# Patient Record
Sex: Male | Born: 1935 | Race: White | Hispanic: No | Smoking: Former smoker
Health system: Southern US, Community
[De-identification: ages and names within clinical notes are randomized; demographics above are authoritative.]

## PROBLEM LIST (undated history)

## (undated) DIAGNOSIS — I509 Heart failure, unspecified: Secondary | ICD-10-CM

## (undated) DIAGNOSIS — N183 Chronic kidney disease, stage 3 unspecified: Secondary | ICD-10-CM

## (undated) DIAGNOSIS — I10 Essential (primary) hypertension: Secondary | ICD-10-CM

## (undated) DIAGNOSIS — I2510 Atherosclerotic heart disease of native coronary artery without angina pectoris: Secondary | ICD-10-CM

## (undated) DIAGNOSIS — M1990 Unspecified osteoarthritis, unspecified site: Secondary | ICD-10-CM

## (undated) DIAGNOSIS — I219 Acute myocardial infarction, unspecified: Secondary | ICD-10-CM

## (undated) DIAGNOSIS — D649 Anemia, unspecified: Secondary | ICD-10-CM

## (undated) DIAGNOSIS — E119 Type 2 diabetes mellitus without complications: Secondary | ICD-10-CM

## (undated) HISTORY — DX: Acute myocardial infarction, unspecified: I21.9

## (undated) HISTORY — PX: TOTAL HIP ARTHROPLASTY: SHX124

## (undated) HISTORY — DX: Heart failure, unspecified: I50.9

## (undated) HISTORY — PX: ENUCLEATION: SHX628

## (undated) HISTORY — DX: Unspecified osteoarthritis, unspecified site: M19.90

## (undated) HISTORY — DX: Atherosclerotic heart disease of native coronary artery without angina pectoris: I25.10

## (undated) HISTORY — DX: Anemia, unspecified: D64.9

## (undated) HISTORY — DX: Type 2 diabetes mellitus without complications: E11.9

## (undated) HISTORY — PX: APPENDECTOMY: SHX54

## (undated) HISTORY — PX: HIP HARDWARE REMOVAL: SUR1127

## (undated) HISTORY — DX: Chronic kidney disease, stage 3 unspecified: N18.30

## (undated) HISTORY — PX: CORONARY ARTERY BYPASS GRAFT: SHX141

## (undated) HISTORY — DX: Chronic kidney disease, stage 3 (moderate): N18.3

## (undated) HISTORY — DX: Essential (primary) hypertension: I10

---

## 2005-09-07 ENCOUNTER — Other Ambulatory Visit: Payer: Self-pay

## 2005-09-07 ENCOUNTER — Inpatient Hospital Stay: Payer: Self-pay | Admitting: Orthopaedic Surgery

## 2005-09-09 ENCOUNTER — Other Ambulatory Visit: Payer: Self-pay

## 2006-01-04 ENCOUNTER — Inpatient Hospital Stay: Payer: Self-pay | Admitting: General Practice

## 2008-03-29 ENCOUNTER — Inpatient Hospital Stay: Payer: Self-pay | Admitting: Internal Medicine

## 2008-03-29 ENCOUNTER — Other Ambulatory Visit: Payer: Self-pay

## 2009-12-09 ENCOUNTER — Ambulatory Visit: Payer: Self-pay

## 2009-12-16 ENCOUNTER — Ambulatory Visit: Payer: Self-pay | Admitting: Unknown Physician Specialty

## 2009-12-19 ENCOUNTER — Ambulatory Visit: Payer: Self-pay | Admitting: Unknown Physician Specialty

## 2011-05-17 ENCOUNTER — Ambulatory Visit: Payer: Self-pay | Admitting: Internal Medicine

## 2011-05-25 ENCOUNTER — Inpatient Hospital Stay: Payer: Self-pay | Admitting: Student

## 2011-06-16 ENCOUNTER — Ambulatory Visit: Payer: Self-pay | Admitting: Internal Medicine

## 2011-08-21 ENCOUNTER — Encounter: Payer: Self-pay | Admitting: Cardiology

## 2011-09-14 ENCOUNTER — Encounter: Payer: Self-pay | Admitting: Cardiology

## 2011-10-15 ENCOUNTER — Encounter: Payer: Self-pay | Admitting: Cardiology

## 2011-10-30 ENCOUNTER — Ambulatory Visit: Payer: Self-pay | Admitting: Internal Medicine

## 2011-10-30 ENCOUNTER — Inpatient Hospital Stay: Payer: Self-pay | Admitting: Surgery

## 2011-10-30 LAB — PROTIME-INR
INR: 1.1
Prothrombin Time: 14.5 secs (ref 11.5–14.7)

## 2011-10-30 LAB — CBC WITH DIFFERENTIAL/PLATELET
Basophil #: 0 10*3/uL (ref 0.0–0.1)
Basophil %: 0 %
Eosinophil #: 0 10*3/uL (ref 0.0–0.7)
Eosinophil %: 0.3 %
HCT: 29.4 % — ABNORMAL LOW (ref 40.0–52.0)
Lymphocyte %: 13.8 %
MCHC: 34.4 g/dL (ref 32.0–36.0)
MCV: 93 fL (ref 80–100)
Neutrophil #: 7.7 10*3/uL — ABNORMAL HIGH (ref 1.4–6.5)
Neutrophil %: 80.3 %
RBC: 3.18 10*6/uL — ABNORMAL LOW (ref 4.40–5.90)
RDW: 14.8 % — ABNORMAL HIGH (ref 11.5–14.5)

## 2011-10-30 LAB — COMPREHENSIVE METABOLIC PANEL
Albumin: 2 g/dL — ABNORMAL LOW (ref 3.4–5.0)
Alkaline Phosphatase: 330 U/L — ABNORMAL HIGH (ref 50–136)
Anion Gap: 17 — ABNORMAL HIGH (ref 7–16)
Bilirubin,Total: 7.6 mg/dL — ABNORMAL HIGH (ref 0.2–1.0)
Co2: 19 mmol/L — ABNORMAL LOW (ref 21–32)
EGFR (African American): 12 — ABNORMAL LOW
EGFR (Non-African Amer.): 10 — ABNORMAL LOW
Osmolality: 300 (ref 275–301)
Potassium: 4.6 mmol/L (ref 3.5–5.1)
SGPT (ALT): 30 U/L
Sodium: 131 mmol/L — ABNORMAL LOW (ref 136–145)
Total Protein: 5.1 g/dL — ABNORMAL LOW (ref 6.4–8.2)

## 2011-10-30 LAB — LIPASE, BLOOD: Lipase: 1377 U/L — ABNORMAL HIGH (ref 73–393)

## 2011-10-31 LAB — COMPREHENSIVE METABOLIC PANEL
Albumin: 1.9 g/dL — ABNORMAL LOW (ref 3.4–5.0)
Anion Gap: 14 (ref 7–16)
Bilirubin,Total: 6.8 mg/dL — ABNORMAL HIGH (ref 0.2–1.0)
Co2: 22 mmol/L (ref 21–32)
Creatinine: 4.68 mg/dL — ABNORMAL HIGH (ref 0.60–1.30)
EGFR (Non-African Amer.): 10 — ABNORMAL LOW
Osmolality: 300 (ref 275–301)
SGPT (ALT): 26 U/L
Sodium: 130 mmol/L — ABNORMAL LOW (ref 136–145)
Total Protein: 5.5 g/dL — ABNORMAL LOW (ref 6.4–8.2)

## 2011-10-31 LAB — LIPASE, BLOOD: Lipase: 1237 U/L — ABNORMAL HIGH (ref 73–393)

## 2011-10-31 LAB — URINALYSIS, COMPLETE
Granular Cast: 38
Leukocyte Esterase: NEGATIVE
Nitrite: NEGATIVE
Protein: 30
Specific Gravity: 1.012 (ref 1.003–1.030)
Squamous Epithelial: NONE SEEN
Transitional Epi: <1
WBC UR: 35 /HPF (ref 0–5)

## 2011-10-31 LAB — CBC WITH DIFFERENTIAL/PLATELET
Basophil #: 0 10*3/uL (ref 0.0–0.1)
Basophil %: 0.2 %
Eosinophil #: 0.1 10*3/uL (ref 0.0–0.7)
Eosinophil %: 0.5 %
HGB: 9.9 g/dL — ABNORMAL LOW (ref 13.0–18.0)
Monocyte #: 0.5 x10 3/mm (ref 0.2–1.0)
Neutrophil #: 9.8 10*3/uL — ABNORMAL HIGH (ref 1.4–6.5)
Neutrophil %: 90 %
Platelet: 74 10*3/uL — ABNORMAL LOW (ref 150–440)
RDW: 14.5 % (ref 11.5–14.5)
WBC: 10.8 10*3/uL — ABNORMAL HIGH (ref 3.8–10.6)

## 2011-11-01 LAB — COMPREHENSIVE METABOLIC PANEL
Albumin: 1.6 g/dL — ABNORMAL LOW (ref 3.4–5.0)
Alkaline Phosphatase: 260 U/L — ABNORMAL HIGH (ref 50–136)
BUN: 82 mg/dL — ABNORMAL HIGH (ref 7–18)
Calcium, Total: 7.6 mg/dL — ABNORMAL LOW (ref 8.5–10.1)
Creatinine: 3.6 mg/dL — ABNORMAL HIGH (ref 0.60–1.30)
EGFR (African American): 16 — ABNORMAL LOW
EGFR (Non-African Amer.): 14 — ABNORMAL LOW
Glucose: 82 mg/dL (ref 65–99)
Osmolality: 294 (ref 275–301)
Potassium: 3.6 mmol/L (ref 3.5–5.1)
SGOT(AST): 32 U/L (ref 15–37)
SGPT (ALT): 19 U/L

## 2011-11-01 LAB — PROTEIN ELECTROPHORESIS(ARMC)

## 2011-11-02 LAB — PROTEIN / CREATININE RATIO, URINE
Creatinine, Urine: 168.7 mg/dL — ABNORMAL HIGH (ref 30.0–125.0)
Protein, Random Urine: 44 mg/dL — ABNORMAL HIGH (ref 0–12)
Protein/Creat. Ratio: 261 mg/gCREAT — ABNORMAL HIGH (ref 0–200)

## 2011-11-02 LAB — CBC WITH DIFFERENTIAL/PLATELET
Basophil %: 0.2 %
HCT: 25.8 % — ABNORMAL LOW (ref 40.0–52.0)
HGB: 8.6 g/dL — ABNORMAL LOW (ref 13.0–18.0)
Lymphocyte %: 9.9 %
MCV: 94 fL (ref 80–100)
Monocyte #: 1 x10 3/mm (ref 0.2–1.0)
Monocyte %: 12.1 %
Neutrophil %: 76.8 %
RBC: 2.76 10*6/uL — ABNORMAL LOW (ref 4.40–5.90)
RDW: 14.3 % (ref 11.5–14.5)
WBC: 8.7 10*3/uL (ref 3.8–10.6)

## 2011-11-02 LAB — COMPREHENSIVE METABOLIC PANEL
Alkaline Phosphatase: 238 U/L — ABNORMAL HIGH (ref 50–136)
Anion Gap: 11 (ref 7–16)
Chloride: 102 mmol/L (ref 98–107)
Co2: 25 mmol/L (ref 21–32)
EGFR (Non-African Amer.): 22 — ABNORMAL LOW
Osmolality: 289 (ref 275–301)
SGOT(AST): 31 U/L (ref 15–37)
SGPT (ALT): 14 U/L
Total Protein: 4.9 g/dL — ABNORMAL LOW (ref 6.4–8.2)

## 2011-11-02 LAB — PHOSPHORUS: Phosphorus: 3 mg/dL (ref 2.5–4.9)

## 2011-11-02 LAB — LIPASE, BLOOD: Lipase: 974 U/L — ABNORMAL HIGH (ref 73–393)

## 2011-11-03 LAB — COMPREHENSIVE METABOLIC PANEL
Albumin: 1.5 g/dL — ABNORMAL LOW (ref 3.4–5.0)
Alkaline Phosphatase: 239 U/L — ABNORMAL HIGH (ref 50–136)
Anion Gap: 9 (ref 7–16)
BUN: 28 mg/dL — ABNORMAL HIGH (ref 7–18)
Calcium, Total: 7.3 mg/dL — ABNORMAL LOW (ref 8.5–10.1)
Chloride: 101 mmol/L (ref 98–107)
Co2: 26 mmol/L (ref 21–32)
EGFR (African American): 35 — ABNORMAL LOW
EGFR (Non-African Amer.): 30 — ABNORMAL LOW
Glucose: 99 mg/dL (ref 65–99)
Potassium: 3.3 mmol/L — ABNORMAL LOW (ref 3.5–5.1)
SGOT(AST): 34 U/L (ref 15–37)

## 2011-11-03 LAB — LIPASE, BLOOD: Lipase: 738 U/L — ABNORMAL HIGH (ref 73–393)

## 2011-11-03 LAB — HEMOGLOBIN: HGB: 8.1 g/dL — ABNORMAL LOW (ref 13.0–18.0)

## 2011-11-04 LAB — BASIC METABOLIC PANEL
Anion Gap: 9 (ref 7–16)
Chloride: 104 mmol/L (ref 98–107)
Creatinine: 2.5 mg/dL — ABNORMAL HIGH (ref 0.60–1.30)
EGFR (African American): 25 — ABNORMAL LOW
EGFR (Non-African Amer.): 22 — ABNORMAL LOW
Glucose: 214 mg/dL — ABNORMAL HIGH (ref 65–99)
Osmolality: 289 (ref 275–301)
Potassium: 4.3 mmol/L (ref 3.5–5.1)
Sodium: 138 mmol/L (ref 136–145)

## 2011-11-05 LAB — PHOSPHORUS: Phosphorus: 3.3 mg/dL (ref 2.5–4.9)

## 2011-11-05 LAB — BASIC METABOLIC PANEL
Anion Gap: 8 (ref 7–16)
Calcium, Total: 7.7 mg/dL — ABNORMAL LOW (ref 8.5–10.1)
Chloride: 106 mmol/L (ref 98–107)
Creatinine: 2.35 mg/dL — ABNORMAL HIGH (ref 0.60–1.30)
EGFR (African American): 27 — ABNORMAL LOW
Glucose: 177 mg/dL — ABNORMAL HIGH (ref 65–99)
Osmolality: 291 (ref 275–301)
Sodium: 140 mmol/L (ref 136–145)

## 2011-11-05 LAB — LIPASE, BLOOD: Lipase: 953 U/L — ABNORMAL HIGH (ref 73–393)

## 2011-11-05 LAB — HEPATIC FUNCTION PANEL A (ARMC)
Albumin: 1.6 g/dL — ABNORMAL LOW (ref 3.4–5.0)
Bilirubin, Direct: 1.6 mg/dL — ABNORMAL HIGH (ref 0.00–0.20)
Bilirubin,Total: 2.2 mg/dL — ABNORMAL HIGH (ref 0.2–1.0)
SGOT(AST): 33 U/L (ref 15–37)
SGPT (ALT): 11 U/L — ABNORMAL LOW
Total Protein: 5.4 g/dL — ABNORMAL LOW (ref 6.4–8.2)

## 2011-11-06 LAB — CBC WITH DIFFERENTIAL/PLATELET
Basophil #: 0.1 10*3/uL (ref 0.0–0.1)
Basophil %: 1.1 %
Eosinophil #: 0.2 10*3/uL (ref 0.0–0.7)
Eosinophil %: 3.9 %
HCT: 25.6 % — ABNORMAL LOW (ref 40.0–52.0)
HGB: 8.4 g/dL — ABNORMAL LOW (ref 13.0–18.0)
Lymphocyte %: 18.7 %
MCV: 95 fL (ref 80–100)
Monocyte %: 8.9 %
Neutrophil %: 67.4 %
Platelet: 195 10*3/uL (ref 150–440)
RDW: 14.7 % — ABNORMAL HIGH (ref 11.5–14.5)
WBC: 5.3 10*3/uL (ref 3.8–10.6)

## 2011-11-06 LAB — UR PROT ELECTROPHORESIS, URINE RANDOM

## 2011-11-07 LAB — CULTURE, BLOOD (SINGLE)

## 2011-11-08 LAB — BASIC METABOLIC PANEL
Anion Gap: 7 (ref 7–16)
BUN: 31 mg/dL — ABNORMAL HIGH (ref 7–18)
Calcium, Total: 7.2 mg/dL — ABNORMAL LOW (ref 8.5–10.1)
Creatinine: 1.67 mg/dL — ABNORMAL HIGH (ref 0.60–1.30)
EGFR (African American): 41 — ABNORMAL LOW
EGFR (Non-African Amer.): 35 — ABNORMAL LOW
Glucose: 76 mg/dL (ref 65–99)
Osmolality: 288 (ref 275–301)
Potassium: 5.4 mmol/L — ABNORMAL HIGH (ref 3.5–5.1)
Sodium: 142 mmol/L (ref 136–145)

## 2011-11-21 ENCOUNTER — Other Ambulatory Visit: Payer: Self-pay | Admitting: Nephrology

## 2011-11-21 LAB — PROTEIN URINE, QUAL: Protein: NEGATIVE

## 2011-11-21 LAB — RENAL FUNCTION PANEL
Albumin: 2.8 g/dL — ABNORMAL LOW (ref 3.4–5.0)
BUN: 34 mg/dL — ABNORMAL HIGH (ref 7–18)
Chloride: 99 mmol/L (ref 98–107)
EGFR (Non-African Amer.): 25 — ABNORMAL LOW
Phosphorus: 3.7 mg/dL (ref 2.5–4.9)

## 2011-11-21 LAB — CBC WITH DIFFERENTIAL/PLATELET
Eosinophil #: 0.6 10*3/uL (ref 0.0–0.7)
Eosinophil %: 9.6 %
HGB: 10.8 g/dL — ABNORMAL LOW (ref 13.0–18.0)
Lymphocyte #: 1.7 10*3/uL (ref 1.0–3.6)
MCH: 31.4 pg (ref 26.0–34.0)
Monocyte #: 0.5 x10 3/mm (ref 0.2–1.0)
Monocyte %: 7.9 %
Neutrophil #: 3.6 10*3/uL (ref 1.4–6.5)
Neutrophil %: 55.2 %
Platelet: 178 10*3/uL (ref 150–440)
RDW: 15.3 % — ABNORMAL HIGH (ref 11.5–14.5)
WBC: 6.5 10*3/uL (ref 3.8–10.6)

## 2012-07-02 ENCOUNTER — Inpatient Hospital Stay: Payer: Self-pay | Admitting: Orthopedic Surgery

## 2012-07-02 LAB — COMPREHENSIVE METABOLIC PANEL
Albumin: 2.8 g/dL — ABNORMAL LOW (ref 3.4–5.0)
Alkaline Phosphatase: 171 U/L — ABNORMAL HIGH (ref 50–136)
Anion Gap: 5 — ABNORMAL LOW (ref 7–16)
BUN: 33 mg/dL — ABNORMAL HIGH (ref 7–18)
Bilirubin,Total: 0.6 mg/dL (ref 0.2–1.0)
Calcium, Total: 8.8 mg/dL (ref 8.5–10.1)
Co2: 27 mmol/L (ref 21–32)
EGFR (African American): 40 — ABNORMAL LOW
Glucose: 107 mg/dL — ABNORMAL HIGH (ref 65–99)
Osmolality: 289 (ref 275–301)
Potassium: 4.9 mmol/L (ref 3.5–5.1)
SGPT (ALT): 14 U/L (ref 12–78)
Sodium: 141 mmol/L (ref 136–145)

## 2012-07-02 LAB — CBC WITH DIFFERENTIAL/PLATELET
Basophil #: 0 10*3/uL (ref 0.0–0.1)
Basophil %: 0.8 %
Eosinophil %: 4.3 %
HCT: 32.3 % — ABNORMAL LOW (ref 40.0–52.0)
HGB: 10.3 g/dL — ABNORMAL LOW (ref 13.0–18.0)
Lymphocyte %: 21.6 %
MCH: 31 pg (ref 26.0–34.0)
Monocyte %: 7 %
Neutrophil #: 3.6 10*3/uL (ref 1.4–6.5)
Platelet: 124 10*3/uL — ABNORMAL LOW (ref 150–440)
RBC: 3.33 10*6/uL — ABNORMAL LOW (ref 4.40–5.90)
WBC: 5.4 10*3/uL (ref 3.8–10.6)

## 2012-07-02 LAB — PROTIME-INR
INR: 0.9
Prothrombin Time: 12.8 secs (ref 11.5–14.7)

## 2012-07-03 HISTORY — PX: HIP PINNING: SHX1757

## 2012-07-03 LAB — BASIC METABOLIC PANEL
Anion Gap: 7 (ref 7–16)
BUN: 31 mg/dL — ABNORMAL HIGH (ref 7–18)
Calcium, Total: 8.5 mg/dL (ref 8.5–10.1)
Chloride: 106 mmol/L (ref 98–107)
Creatinine: 1.68 mg/dL — ABNORMAL HIGH (ref 0.60–1.30)
EGFR (Non-African Amer.): 35 — ABNORMAL LOW
Glucose: 121 mg/dL — ABNORMAL HIGH (ref 65–99)
Osmolality: 285 (ref 275–301)
Potassium: 4.3 mmol/L (ref 3.5–5.1)

## 2012-07-03 LAB — CBC WITH DIFFERENTIAL/PLATELET
Basophil #: 0 10*3/uL (ref 0.0–0.1)
Eosinophil #: 0.3 10*3/uL (ref 0.0–0.7)
HCT: 29.8 % — ABNORMAL LOW (ref 40.0–52.0)
HGB: 9.8 g/dL — ABNORMAL LOW (ref 13.0–18.0)
Lymphocyte %: 21 %
MCH: 32 pg (ref 26.0–34.0)
MCHC: 32.9 g/dL (ref 32.0–36.0)
Monocyte #: 0.5 x10 3/mm (ref 0.2–1.0)
Neutrophil #: 3.3 10*3/uL (ref 1.4–6.5)
Neutrophil %: 63.3 %
Platelet: 102 10*3/uL — ABNORMAL LOW (ref 150–440)
RDW: 13.4 % (ref 11.5–14.5)

## 2012-07-04 LAB — CBC WITH DIFFERENTIAL/PLATELET
Basophil %: 0.5 %
Eosinophil #: 0.2 10*3/uL (ref 0.0–0.7)
Eosinophil %: 3.3 %
HGB: 9.8 g/dL — ABNORMAL LOW (ref 13.0–18.0)
MCH: 35.4 pg — ABNORMAL HIGH (ref 26.0–34.0)
MCV: 97 fL (ref 80–100)
Monocyte #: 0.6 x10 3/mm (ref 0.2–1.0)
Monocyte %: 9.7 %
Neutrophil #: 4.1 10*3/uL (ref 1.4–6.5)
Neutrophil %: 65.9 %
Platelet: 103 10*3/uL — ABNORMAL LOW (ref 150–440)
RBC: 2.76 10*6/uL — ABNORMAL LOW (ref 4.40–5.90)
WBC: 6.2 10*3/uL (ref 3.8–10.6)

## 2012-07-04 LAB — URINALYSIS, COMPLETE
Bilirubin,UR: NEGATIVE
Blood: NEGATIVE
Glucose,UR: NEGATIVE mg/dL (ref 0–75)
Hyaline Cast: 3
Ketone: NEGATIVE
Leukocyte Esterase: NEGATIVE
Nitrite: NEGATIVE
Protein: NEGATIVE
Specific Gravity: 1.019 (ref 1.003–1.030)
WBC UR: 2 /HPF (ref 0–5)

## 2012-07-04 LAB — BASIC METABOLIC PANEL
Anion Gap: 8 (ref 7–16)
Calcium, Total: 8 mg/dL — ABNORMAL LOW (ref 8.5–10.1)
Chloride: 104 mmol/L (ref 98–107)
Co2: 26 mmol/L (ref 21–32)
EGFR (African American): 33 — ABNORMAL LOW
Sodium: 138 mmol/L (ref 136–145)

## 2012-07-05 LAB — BASIC METABOLIC PANEL
Anion Gap: 10 (ref 7–16)
BUN: 43 mg/dL — ABNORMAL HIGH (ref 7–18)
Chloride: 102 mmol/L (ref 98–107)
Co2: 24 mmol/L (ref 21–32)
Creatinine: 2 mg/dL — ABNORMAL HIGH (ref 0.60–1.30)
EGFR (African American): 33 — ABNORMAL LOW
Potassium: 4.2 mmol/L (ref 3.5–5.1)

## 2012-07-05 LAB — URINE CULTURE

## 2012-07-06 LAB — CREATININE, SERUM
Creatinine: 1.6 mg/dL — ABNORMAL HIGH (ref 0.60–1.30)
EGFR (African American): 43 — ABNORMAL LOW

## 2012-07-07 ENCOUNTER — Encounter: Payer: Self-pay | Admitting: Internal Medicine

## 2012-07-07 LAB — URINALYSIS, COMPLETE
Bilirubin,UR: NEGATIVE
Blood: NEGATIVE
Ketone: NEGATIVE
Ph: 5 (ref 4.5–8.0)
Protein: NEGATIVE
RBC,UR: 1 /HPF (ref 0–5)
Specific Gravity: 1.01 (ref 1.003–1.030)
Squamous Epithelial: <1

## 2012-07-22 ENCOUNTER — Inpatient Hospital Stay: Payer: Self-pay | Admitting: Internal Medicine

## 2012-07-22 LAB — URINALYSIS, COMPLETE
Blood: NEGATIVE
Glucose,UR: NEGATIVE mg/dL (ref 0–75)
Glucose,UR: NEGATIVE mg/dL (ref 0–75)
Hyaline Cast: 12
Ketone: NEGATIVE
Leukocyte Esterase: NEGATIVE
Nitrite: NEGATIVE
Ph: 5 (ref 4.5–8.0)
Protein: NEGATIVE
RBC,UR: 3 /HPF (ref 0–5)
RBC,UR: 2 /HPF (ref 0–5)
Specific Gravity: 1.02 (ref 1.003–1.030)
Squamous Epithelial: <1
WBC UR: <1 /HPF (ref 0–5)

## 2012-07-22 LAB — CBC WITH DIFFERENTIAL/PLATELET
Basophil #: 0 10*3/uL (ref 0.0–0.1)
HCT: 32.8 % — ABNORMAL LOW (ref 40.0–52.0)
HGB: 11.2 g/dL — ABNORMAL LOW (ref 13.0–18.0)
Lymphocyte #: 1 10*3/uL (ref 1.0–3.6)
MCHC: 34.1 g/dL (ref 32.0–36.0)
MCV: 98 fL (ref 80–100)
Monocyte %: 6.4 %
Platelet: 192 10*3/uL (ref 150–440)
RBC: 3.35 10*6/uL — ABNORMAL LOW (ref 4.40–5.90)
WBC: 9 10*3/uL (ref 3.8–10.6)

## 2012-07-22 LAB — COMPREHENSIVE METABOLIC PANEL
Albumin: 2.4 g/dL — ABNORMAL LOW (ref 3.4–5.0)
Alkaline Phosphatase: 183 U/L — ABNORMAL HIGH (ref 50–136)
Anion Gap: 11 (ref 7–16)
Bilirubin,Total: 1.5 mg/dL — ABNORMAL HIGH (ref 0.2–1.0)
Chloride: 98 mmol/L (ref 98–107)
Co2: 23 mmol/L (ref 21–32)
Creatinine: 2.61 mg/dL — ABNORMAL HIGH (ref 0.60–1.30)
Glucose: 215 mg/dL — ABNORMAL HIGH (ref 65–99)
Osmolality: 296 (ref 275–301)
SGOT(AST): 24 U/L (ref 15–37)

## 2012-07-22 LAB — APTT: Activated PTT: 31.2 secs (ref 23.6–35.9)

## 2012-07-22 LAB — TSH: Thyroid Stimulating Horm: 2.92 u[IU]/mL

## 2012-07-22 LAB — TROPONIN I
Troponin-I: 0.06 ng/mL — ABNORMAL HIGH
Troponin-I: 0.04 ng/mL

## 2012-07-22 LAB — CK TOTAL AND CKMB (NOT AT ARMC)
CK, Total: 43 U/L (ref 35–232)
CK-MB: 1.3 ng/mL (ref 0.5–3.6)

## 2012-07-22 LAB — PROTIME-INR: Prothrombin Time: 14.9 secs — ABNORMAL HIGH (ref 11.5–14.7)

## 2012-07-23 LAB — TROPONIN I: Troponin-I: 0.04 ng/mL

## 2012-07-23 LAB — COMPREHENSIVE METABOLIC PANEL
Alkaline Phosphatase: 161 U/L — ABNORMAL HIGH (ref 50–136)
Anion Gap: 10 (ref 7–16)
BUN: 83 mg/dL — ABNORMAL HIGH (ref 7–18)
Calcium, Total: 8.6 mg/dL (ref 8.5–10.1)
Chloride: 101 mmol/L (ref 98–107)
Co2: 24 mmol/L (ref 21–32)
Creatinine: 2.46 mg/dL — ABNORMAL HIGH (ref 0.60–1.30)
EGFR (Non-African Amer.): 22 — ABNORMAL LOW
Osmolality: 300 (ref 275–301)
Potassium: 4.5 mmol/L (ref 3.5–5.1)
SGOT(AST): 26 U/L (ref 15–37)
SGPT (ALT): 11 U/L — ABNORMAL LOW (ref 12–78)
Total Protein: 5.6 g/dL — ABNORMAL LOW (ref 6.4–8.2)

## 2012-07-23 LAB — CBC WITH DIFFERENTIAL/PLATELET
Eosinophil #: 0 10*3/uL (ref 0.0–0.7)
Eosinophil %: 0 %
HCT: 30.1 % — ABNORMAL LOW (ref 40.0–52.0)
HGB: 10.1 g/dL — ABNORMAL LOW (ref 13.0–18.0)
Lymphocyte #: 0.7 10*3/uL — ABNORMAL LOW (ref 1.0–3.6)
Lymphocyte %: 8.9 %
MCH: 32.9 pg (ref 26.0–34.0)
Monocyte #: 0.4 x10 3/mm (ref 0.2–1.0)
Monocyte %: 5.1 %
Neutrophil #: 6.5 10*3/uL (ref 1.4–6.5)
RBC: 3.08 10*6/uL — ABNORMAL LOW (ref 4.40–5.90)
RDW: 14.7 % — ABNORMAL HIGH (ref 11.5–14.5)
WBC: 7.5 10*3/uL (ref 3.8–10.6)

## 2012-07-23 LAB — CK TOTAL AND CKMB (NOT AT ARMC)
CK, Total: 28 U/L — ABNORMAL LOW (ref 35–232)
CK, Total: 22 U/L — ABNORMAL LOW (ref 35–232)
CK-MB: 1.1 ng/mL (ref 0.5–3.6)

## 2012-07-23 LAB — URINE CULTURE

## 2012-07-24 LAB — BASIC METABOLIC PANEL
Anion Gap: 9 (ref 7–16)
BUN: 85 mg/dL — ABNORMAL HIGH (ref 7–18)
Calcium, Total: 8.3 mg/dL — ABNORMAL LOW (ref 8.5–10.1)
Co2: 24 mmol/L (ref 21–32)
Glucose: 174 mg/dL — ABNORMAL HIGH (ref 65–99)
Potassium: 3.9 mmol/L (ref 3.5–5.1)
Sodium: 139 mmol/L (ref 136–145)

## 2012-07-25 LAB — BASIC METABOLIC PANEL
Anion Gap: 8 (ref 7–16)
BUN: 73 mg/dL — ABNORMAL HIGH (ref 7–18)
Calcium, Total: 8.5 mg/dL (ref 8.5–10.1)
Co2: 24 mmol/L (ref 21–32)
EGFR (African American): 36 — ABNORMAL LOW
EGFR (Non-African Amer.): 31 — ABNORMAL LOW
Osmolality: 306 (ref 275–301)
Potassium: 3.4 mmol/L — ABNORMAL LOW (ref 3.5–5.1)
Sodium: 141 mmol/L (ref 136–145)

## 2012-07-25 LAB — CBC WITH DIFFERENTIAL/PLATELET
Basophil #: 0 x10 3/mm 3
Basophil %: 0.1 %
Eosinophil #: 0.1 x10 3/mm 3
Eosinophil %: 2.2 %
HCT: 28.6 % — ABNORMAL LOW
HGB: 9.5 g/dL — ABNORMAL LOW
Lymphocyte %: 13.5 %
Lymphs Abs: 0.8 x10 3/mm 3 — ABNORMAL LOW
MCH: 32.7 pg
MCHC: 33.3 g/dL
MCV: 98 fL
Monocyte #: 0.7 "x10 3/mm "
Monocyte %: 11.7 %
Neutrophil #: 4.1 x10 3/mm 3
Neutrophil %: 72.5 %
Platelet: 177 x10 3/mm 3
RBC: 2.91 x10 6/mm 3 — ABNORMAL LOW
RDW: 15.1 % — ABNORMAL HIGH
WBC: 5.7 x10 3/mm 3

## 2012-07-26 LAB — COMPREHENSIVE METABOLIC PANEL
Albumin: 1.8 g/dL — ABNORMAL LOW (ref 3.4–5.0)
Alkaline Phosphatase: 126 U/L (ref 50–136)
BUN: 57 mg/dL — ABNORMAL HIGH (ref 7–18)
Bilirubin,Total: 0.8 mg/dL (ref 0.2–1.0)
Calcium, Total: 8.1 mg/dL — ABNORMAL LOW (ref 8.5–10.1)
Chloride: 112 mmol/L — ABNORMAL HIGH (ref 98–107)
Co2: 23 mmol/L (ref 21–32)
Creatinine: 1.51 mg/dL — ABNORMAL HIGH (ref 0.60–1.30)
EGFR (African American): 46 — ABNORMAL LOW
EGFR (Non-African Amer.): 40 — ABNORMAL LOW
Glucose: 139 mg/dL — ABNORMAL HIGH (ref 65–99)
Osmolality: 305 (ref 275–301)
Potassium: 3.4 mmol/L — ABNORMAL LOW (ref 3.5–5.1)
SGOT(AST): 33 U/L (ref 15–37)
SGPT (ALT): 15 U/L (ref 12–78)

## 2012-07-27 LAB — CREATININE, SERUM
Creatinine: 1.49 mg/dL — ABNORMAL HIGH (ref 0.60–1.30)
EGFR (African American): 47 — ABNORMAL LOW

## 2012-07-27 LAB — HEMOGLOBIN: HGB: 9.8 g/dL — ABNORMAL LOW (ref 13.0–18.0)

## 2012-07-29 LAB — BASIC METABOLIC PANEL
BUN: 30 mg/dL — ABNORMAL HIGH (ref 7–18)
Calcium, Total: 8.2 mg/dL — ABNORMAL LOW (ref 8.5–10.1)
Chloride: 114 mmol/L — ABNORMAL HIGH (ref 98–107)
Co2: 25 mmol/L (ref 21–32)
Creatinine: 1.35 mg/dL — ABNORMAL HIGH (ref 0.60–1.30)
EGFR (Non-African Amer.): 45 — ABNORMAL LOW
Osmolality: 305 (ref 275–301)
Potassium: 3 mmol/L — ABNORMAL LOW (ref 3.5–5.1)

## 2012-07-30 LAB — PLATELET COUNT: Platelet: 270 10*3/uL (ref 150–440)

## 2012-07-31 ENCOUNTER — Encounter: Payer: Self-pay | Admitting: Internal Medicine

## 2012-08-12 LAB — CBC WITH DIFFERENTIAL/PLATELET
Basophil #: 0 10*3/uL (ref 0.0–0.1)
Basophil %: 0.7 %
Eosinophil #: 0.1 10*3/uL (ref 0.0–0.7)
Eosinophil %: 1.8 %
HCT: 26.1 % — ABNORMAL LOW (ref 40.0–52.0)
HGB: 8.5 g/dL — ABNORMAL LOW (ref 13.0–18.0)
Lymphocyte #: 1 10*3/uL (ref 1.0–3.6)
Monocyte #: 0.6 x10 3/mm (ref 0.2–1.0)
Monocyte %: 11.8 %
Neutrophil %: 64.2 %
Platelet: 146 10*3/uL — ABNORMAL LOW (ref 150–440)
WBC: 4.7 10*3/uL (ref 3.8–10.6)

## 2012-08-12 LAB — COMPREHENSIVE METABOLIC PANEL
Albumin: 1.8 g/dL — ABNORMAL LOW (ref 3.4–5.0)
Alkaline Phosphatase: 111 U/L (ref 50–136)
Bilirubin,Total: 0.6 mg/dL (ref 0.2–1.0)
Calcium, Total: 7.5 mg/dL — ABNORMAL LOW (ref 8.5–10.1)
Glucose: 221 mg/dL — ABNORMAL HIGH (ref 65–99)
Osmolality: 290 (ref 275–301)
Potassium: 4.9 mmol/L (ref 3.5–5.1)
SGPT (ALT): <6 U/L — ABNORMAL LOW (ref 12–78)
Sodium: 136 mmol/L (ref 136–145)
Total Protein: 5.2 g/dL — ABNORMAL LOW (ref 6.4–8.2)

## 2012-08-12 LAB — TSH: Thyroid Stimulating Horm: 3.5 u[IU]/mL

## 2012-08-16 ENCOUNTER — Encounter: Payer: Self-pay | Admitting: Internal Medicine

## 2013-01-08 ENCOUNTER — Ambulatory Visit: Payer: Self-pay | Admitting: Internal Medicine

## 2013-01-27 ENCOUNTER — Inpatient Hospital Stay: Payer: Self-pay | Admitting: Internal Medicine

## 2013-01-27 LAB — CK TOTAL AND CKMB (NOT AT ARMC)
CK, Total: 44 U/L (ref 35–232)
CK-MB: 1.1 ng/mL (ref 0.5–3.6)

## 2013-01-27 LAB — URINALYSIS, COMPLETE
Bilirubin,UR: NEGATIVE
Glucose,UR: 50 mg/dL (ref 0–75)
Ketone: NEGATIVE
Leukocyte Esterase: NEGATIVE
Ph: 5 (ref 4.5–8.0)
Protein: NEGATIVE
Specific Gravity: 1.018 (ref 1.003–1.030)
Squamous Epithelial: NONE SEEN

## 2013-01-27 LAB — TROPONIN I: Troponin-I: 0.06 ng/mL — ABNORMAL HIGH

## 2013-01-27 LAB — CBC
MCHC: 33.4 g/dL (ref 32.0–36.0)
MCV: 96 fL (ref 80–100)
RBC: 3.25 10*6/uL — ABNORMAL LOW (ref 4.40–5.90)
RDW: 14.9 % — ABNORMAL HIGH (ref 11.5–14.5)

## 2013-01-27 LAB — BASIC METABOLIC PANEL
BUN: 44 mg/dL — ABNORMAL HIGH (ref 7–18)
Co2: 22 mmol/L (ref 21–32)
EGFR (African American): 28 — ABNORMAL LOW
Glucose: 283 mg/dL — ABNORMAL HIGH (ref 65–99)
Potassium: 3.4 mmol/L — ABNORMAL LOW (ref 3.5–5.1)
Sodium: 135 mmol/L — ABNORMAL LOW (ref 136–145)

## 2013-01-28 LAB — CBC WITH DIFFERENTIAL/PLATELET
Basophil #: 0 10*3/uL (ref 0.0–0.1)
Basophil %: 0.3 %
Eosinophil #: 0.2 10*3/uL (ref 0.0–0.7)
Eosinophil %: 2.3 %
HCT: 27.7 % — ABNORMAL LOW (ref 40.0–52.0)
HGB: 9.7 g/dL — ABNORMAL LOW (ref 13.0–18.0)
Lymphocyte #: 1.2 10*3/uL (ref 1.0–3.6)
MCH: 33.3 pg (ref 26.0–34.0)
MCHC: 34.9 g/dL (ref 32.0–36.0)
Monocyte #: 0.5 x10 3/mm (ref 0.2–1.0)
Monocyte %: 6.7 %
Platelet: 151 10*3/uL (ref 150–440)

## 2013-01-28 LAB — BASIC METABOLIC PANEL
Anion Gap: 7 (ref 7–16)
BUN: 44 mg/dL — ABNORMAL HIGH (ref 7–18)
Chloride: 104 mmol/L (ref 98–107)
EGFR (African American): 34 — ABNORMAL LOW
Osmolality: 287 (ref 275–301)
Potassium: 3.7 mmol/L (ref 3.5–5.1)
Sodium: 136 mmol/L (ref 136–145)

## 2013-01-28 LAB — TROPONIN I: Troponin-I: 0.04 ng/mL

## 2013-02-01 LAB — CULTURE, BLOOD (SINGLE)

## 2013-06-04 IMAGING — CR DG ABDOMEN 2V
1 series · 3 of 3 positions shown · non-contrast
Comparison: none

REASON FOR EXAM: n/v
COMMENTS:

PROCEDURE:     DXR - DXR ABDOMEN 2 V FLAT AND ERECT  - October 30, 2011  [DATE]
RESULT:
Air is seen within nondilated loops of large and small bowel. There is a
paucity of bowel gas. The bones are osteopenic. The patient is status post
multilevel kyphoplasties within the lower lumbar spine.

[Series 1: x abdomen supine · 0.14mm/px · 3 of 3 slices shown]
[im 1/3]
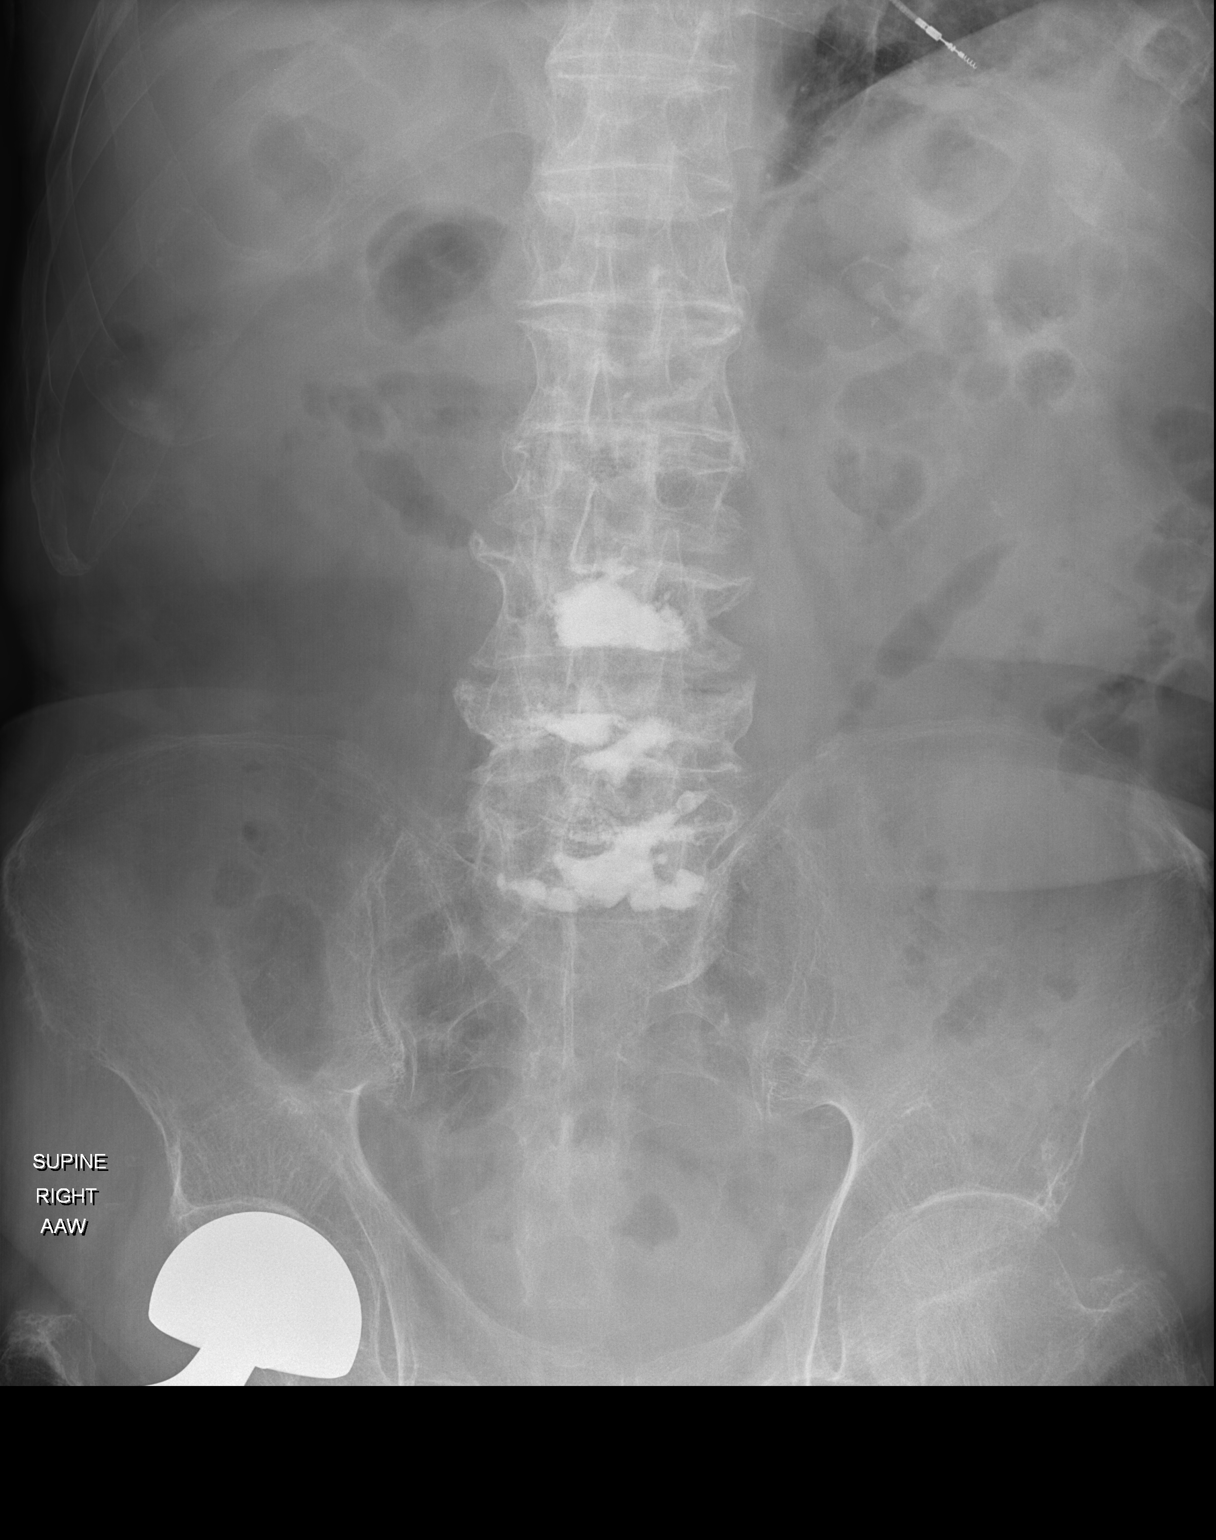
[im 2/3]
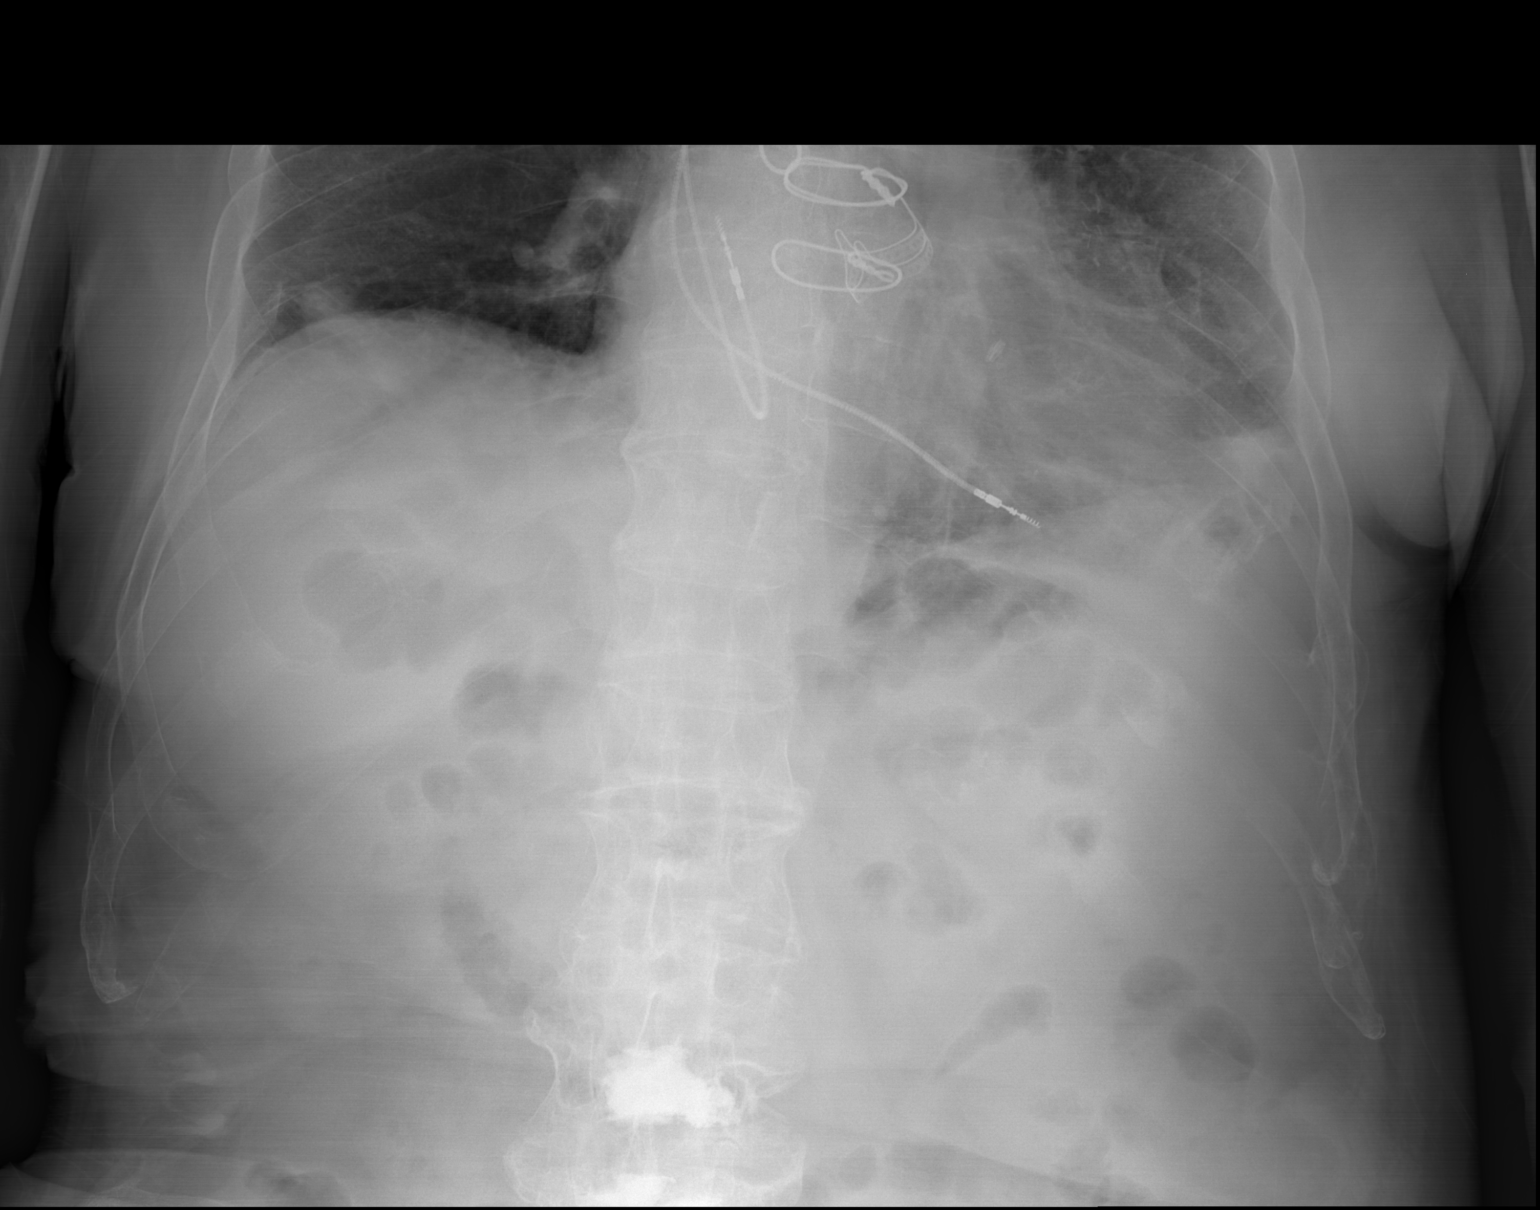
[im 3/3]
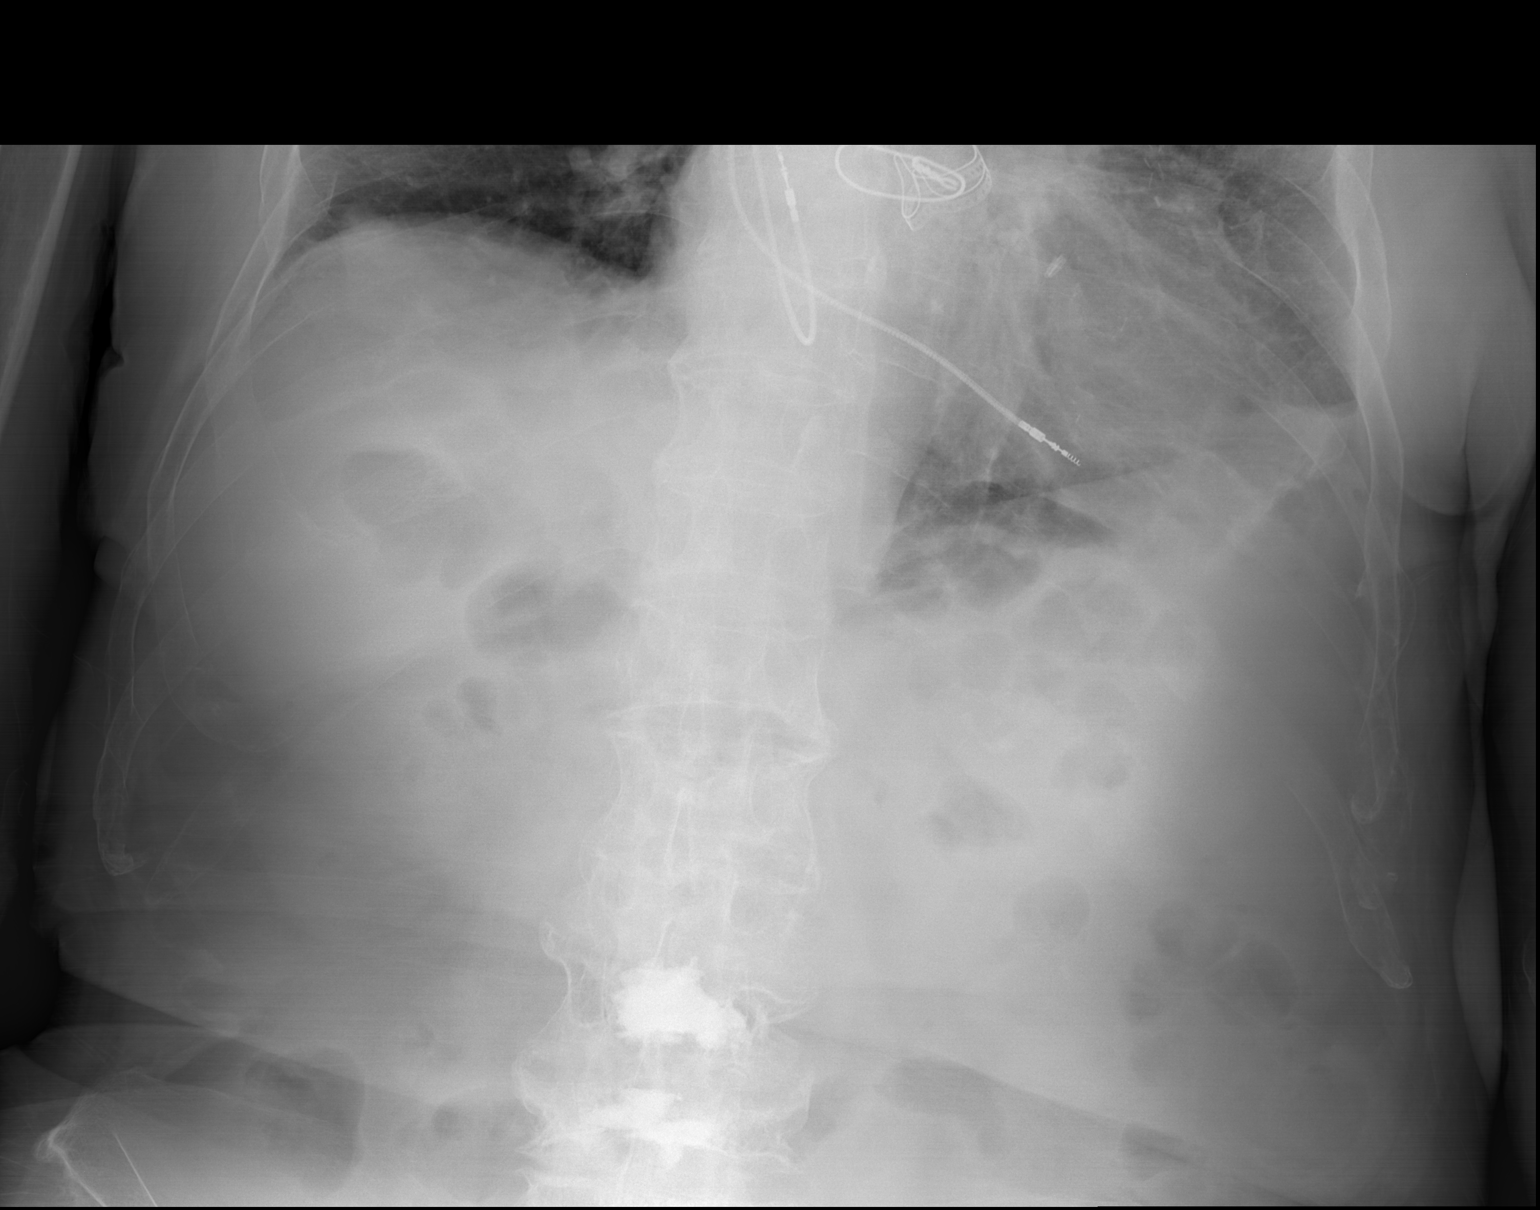

[3 of 3 positions shown; findings below may reference images not displayed]

IMPRESSION: Nonspecific, nonobstructive bowel gas pattern.

## 2013-06-04 IMAGING — US ABDOMEN ULTRASOUND
1 series · 17 of 25 positions shown · non-contrast
Comparison: none

REASON FOR EXAM: STAT CR [id number] RUQ abd pain nausea jaundice
COMMENTS:

PROCEDURE:     MOROZ - MOROZ ABDOMEN GENERAL SURVEY  - October 30, 2011 [DATE]
RESULT:     Comparison: None
TECHNIQUE: Multiple gray-scale and color-flow Doppler images of the abdomen
are presented for review.

[Series 1: abdomen ultrasound · 17 of 80 slices shown]
[im 1/80]
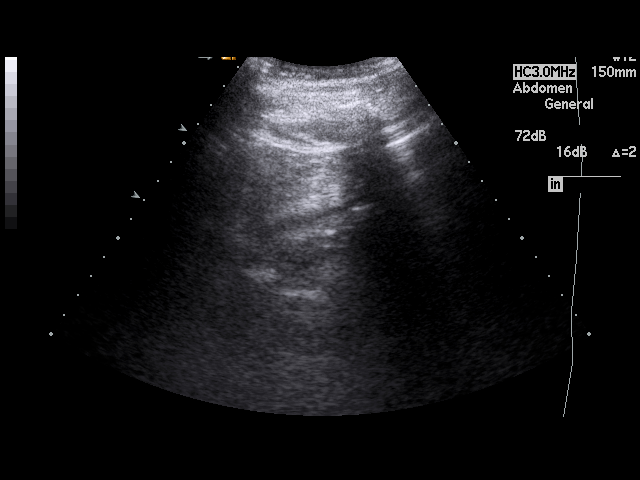
[im 7/80]
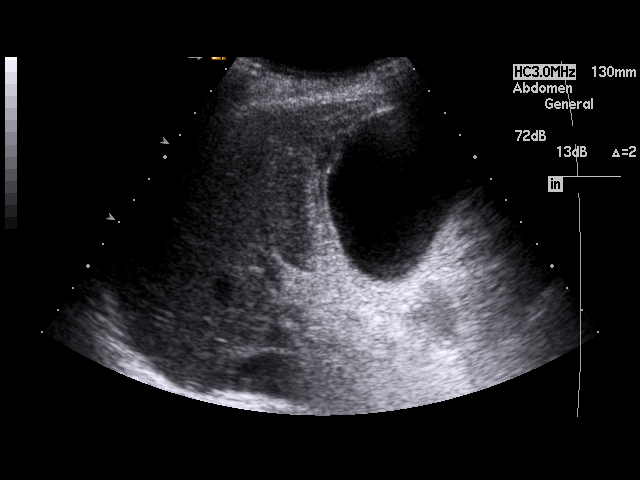
[im 10/80]
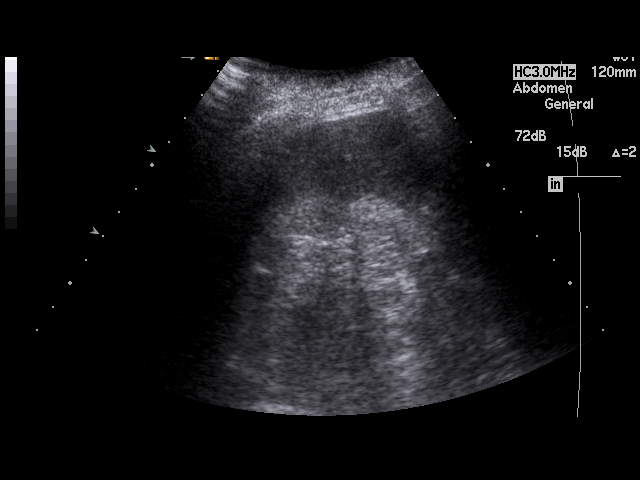
[im 17/80]
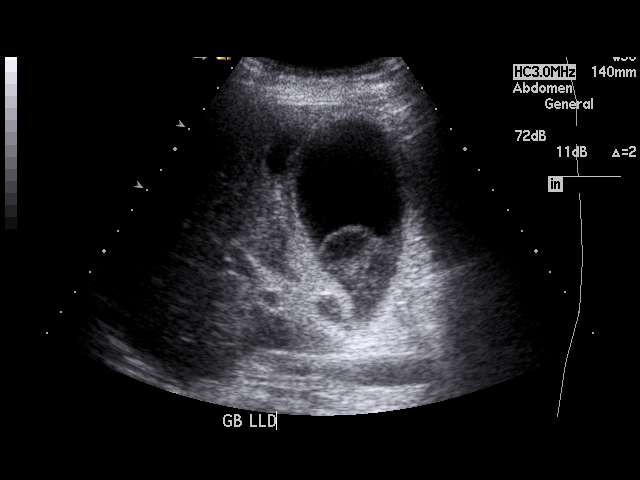
[im 20/80]
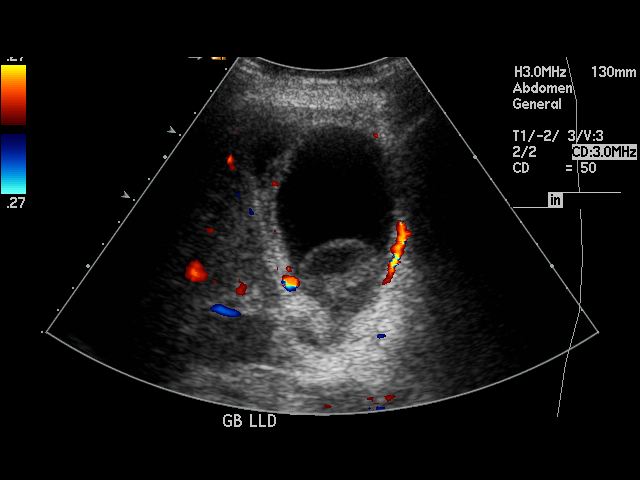
[im 27/80]
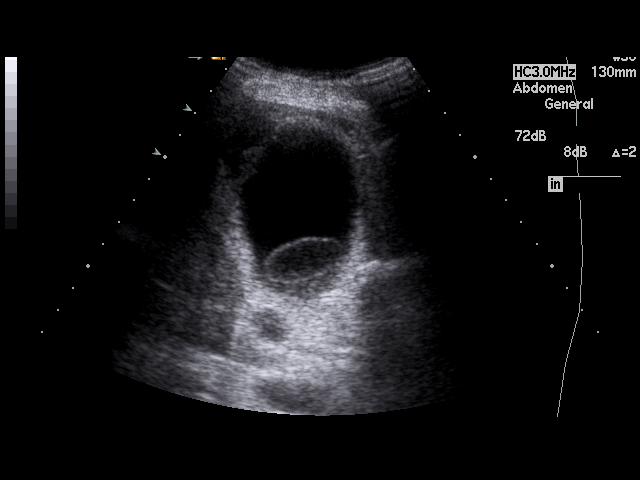
[im 30/80]
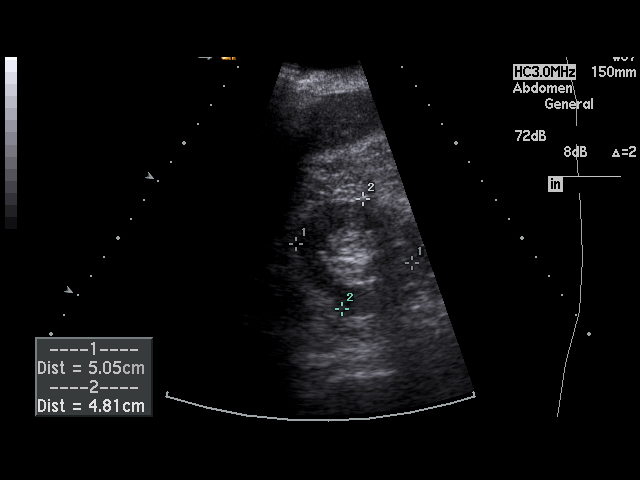
[im 37/80]
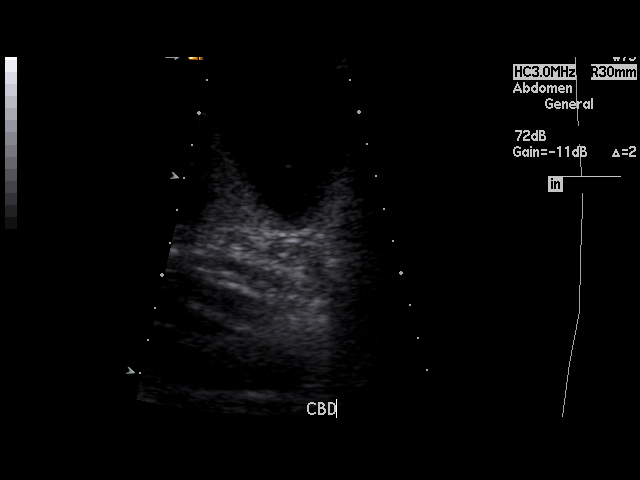
[im 40/80]
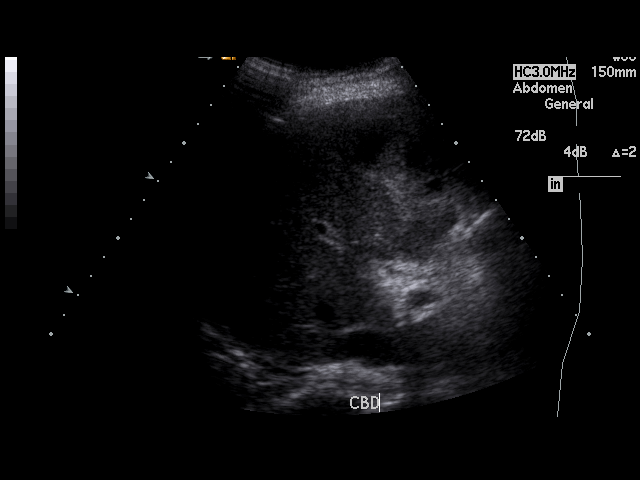
[im 43/80]
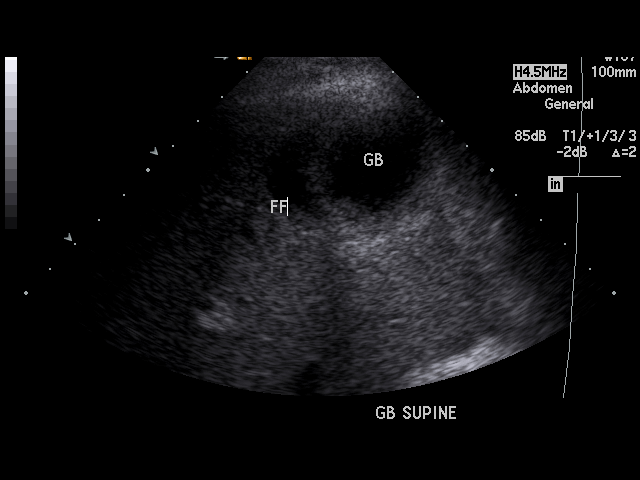
[im 50/80]
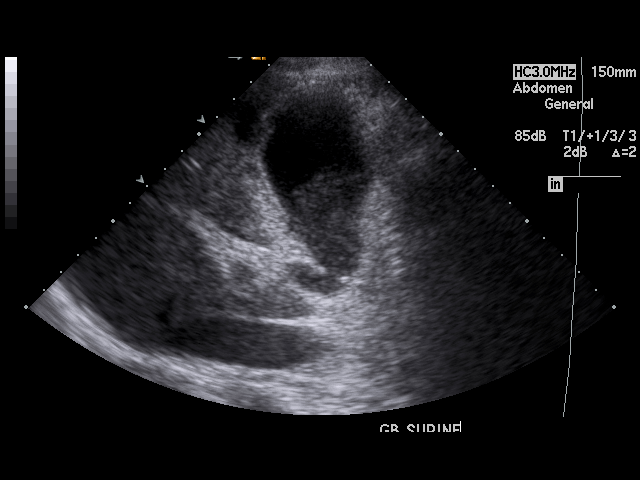
[im 53/80]
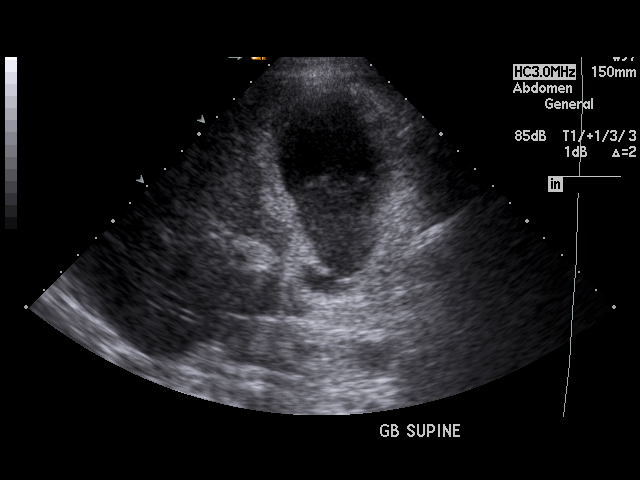
[im 60/80]
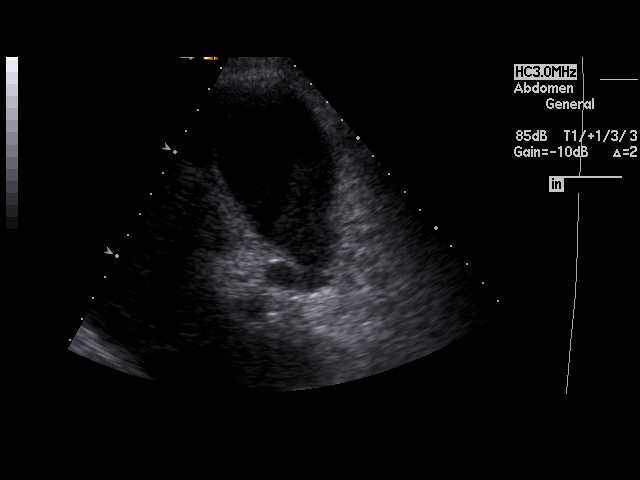
[im 63/80]
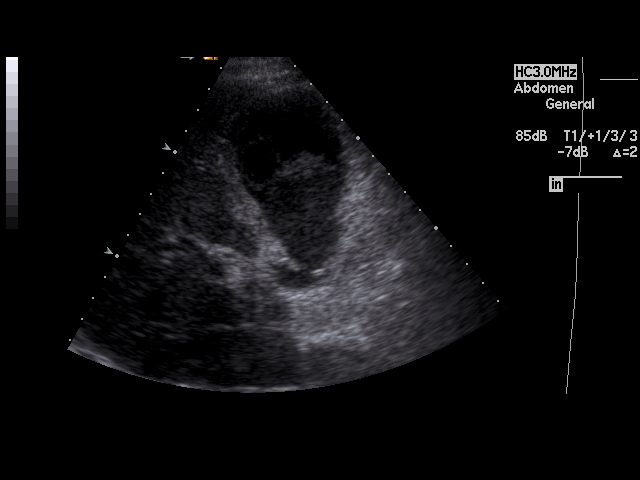
[im 70/80]
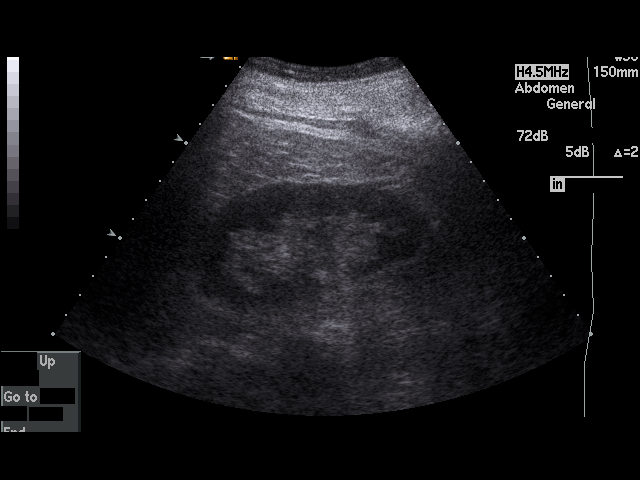
[im 73/80]
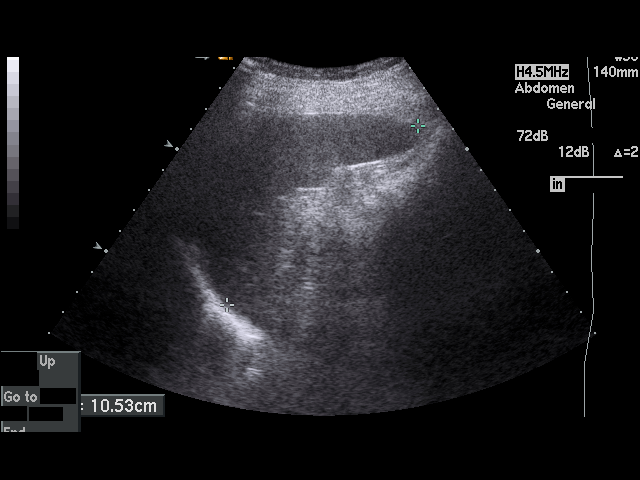
[im 80/80]
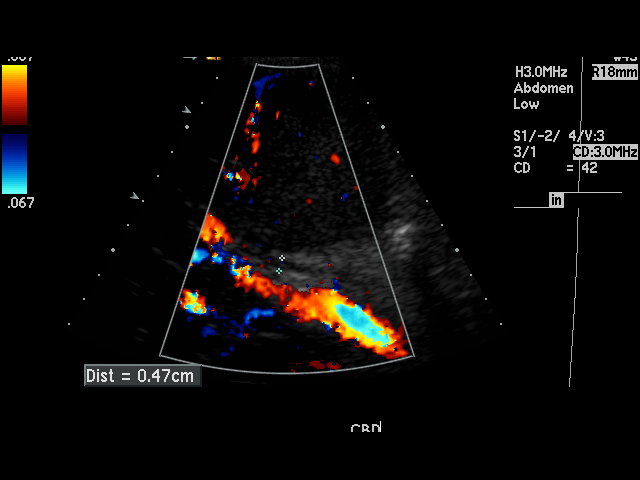

[17 of 25 positions shown; findings below may reference images not displayed]

FINDINGS: Visualized portions of the liver demonstrate normal echogenicity and normal
contours. The liver is without evidence of a focal hepatic lesion.

There are cholelithiasis. There is a mobile echogenic masslike area within
the gallbladder likely representing a sludge ball. The gallbladder wall is
thickened measuring 9.4 mm with pericholecystic fluid present. There is no
intra- or extrahepatic biliary ductal dilatation. The common duct measures 5
mm in maximal

The pancreas is suboptimally visualized. The spleen is unremarkable.
Bilateral kidneys are normal in echogenicity and size. The right kidney
measures 10 x 5 x 4.8 cm. The left kidney measures 10.2 x 4.9 x 5 cm. There
are no renal calculi or hydronephrosis. The abdominal aorta and IVC are
unremarkable.
IMPRESSION: Cholelithiasis with gallbladder wall thickening and pericholecystic fluid
concerning for acute cholecystitis. Surgical consultation recommended.

## 2013-10-14 ENCOUNTER — Ambulatory Visit: Payer: Self-pay | Admitting: Surgery

## 2014-02-25 IMAGING — CT CT ABD-PELV W/O CM
1 of 2 series · 14 of 32 positions shown, 18 images · non-contrast
Comparison: None

REASON FOR EXAM: (1) ABD PAIN, VOMIGING, UNABLE TO TOOLERATE PO, HI
CREAT; (2) ABD PAIN
COMMENTS:

PROCEDURE:     CT  - CT ABDOMEN AND PELVIS W[DATE] [DATE]
RESULT:     Indication: Abdominal pain
TECHNIQUE: Multiple axial images from the lung bases to the symphysis pubis
were obtained without oral and without intravenous contrast.

[Series 2: 3mm soft tissue · axial · 0.80mm/px · z∈[-450,-12]mm · 14 of 167 slices shown, 18 images]
[im 14/167  soft-tissue]
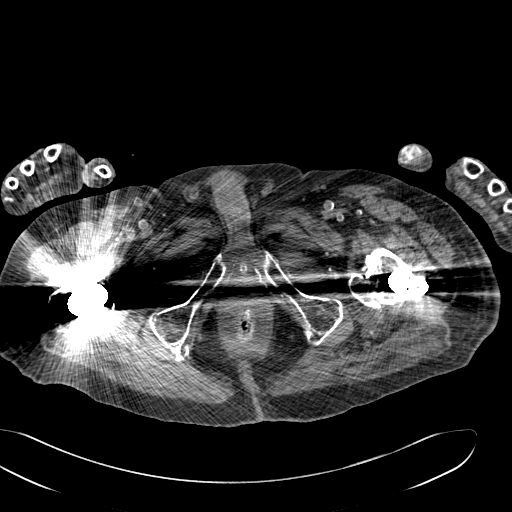
[im 14/167  bone]
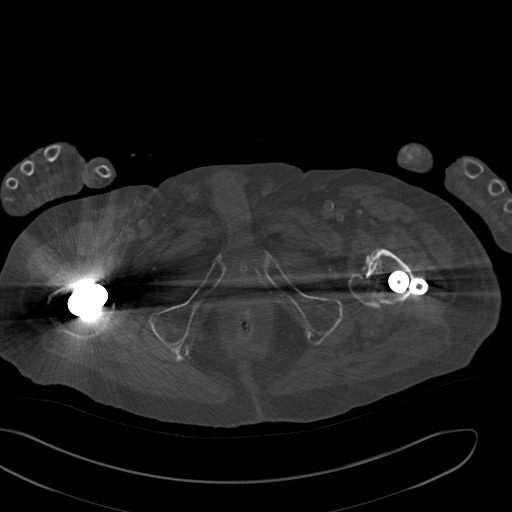
[im 27/167  soft-tissue]
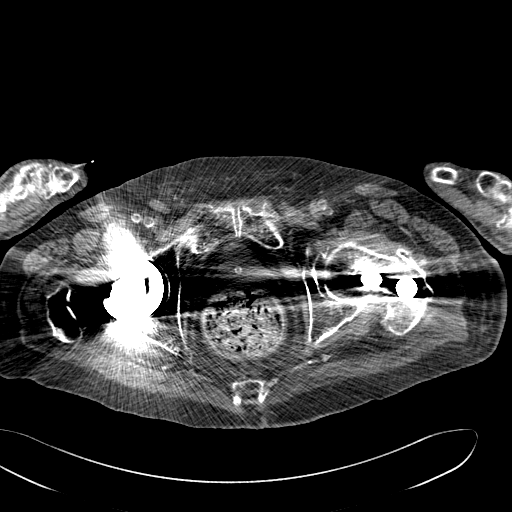
[im 40/167  soft-tissue]
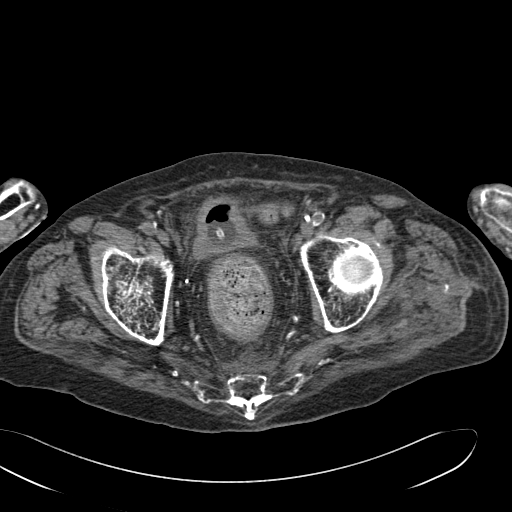
[im 54/167  soft-tissue]
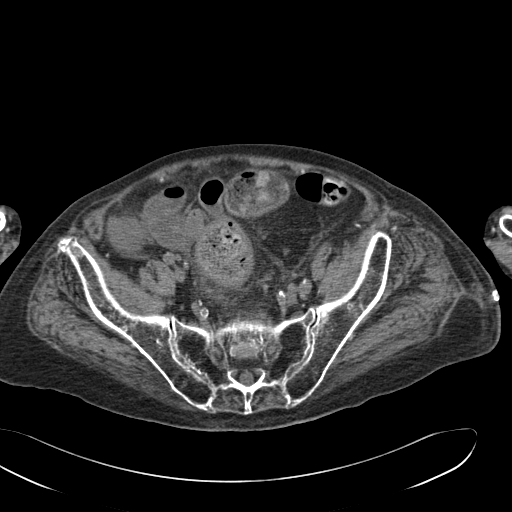
[im 67/167  soft-tissue]
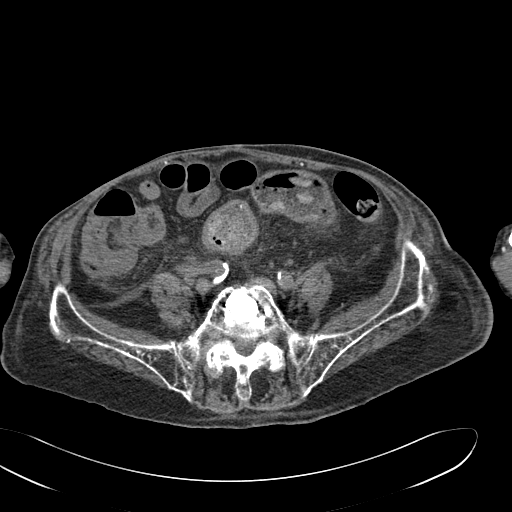
[im 80/167  soft-tissue]
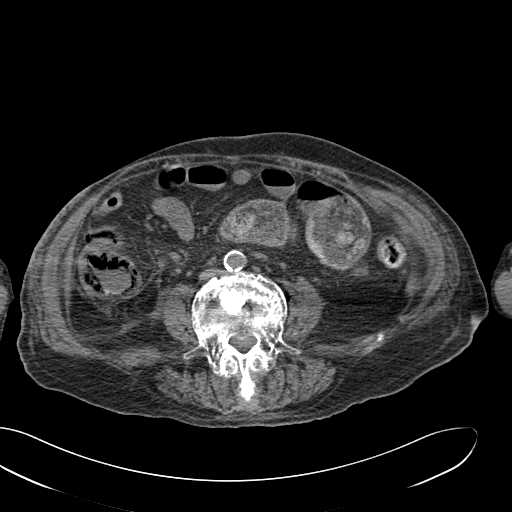
[im 93/167  soft-tissue]
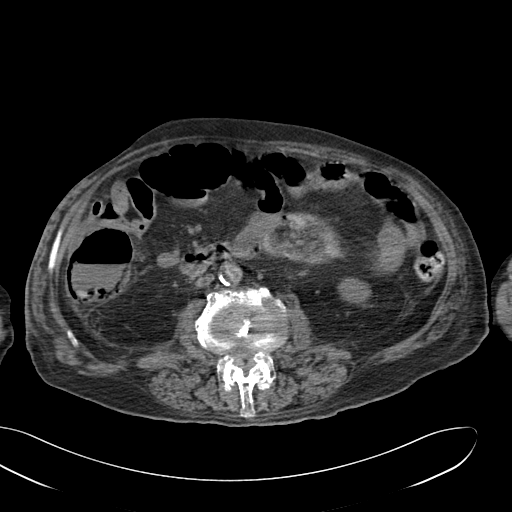
[im 107/167  soft-tissue]
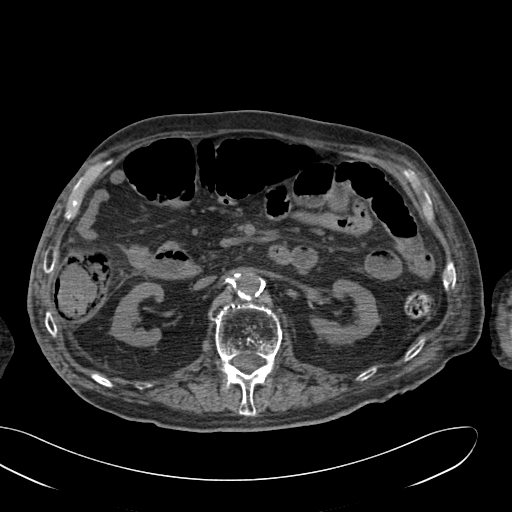
[im 120/167  soft-tissue]
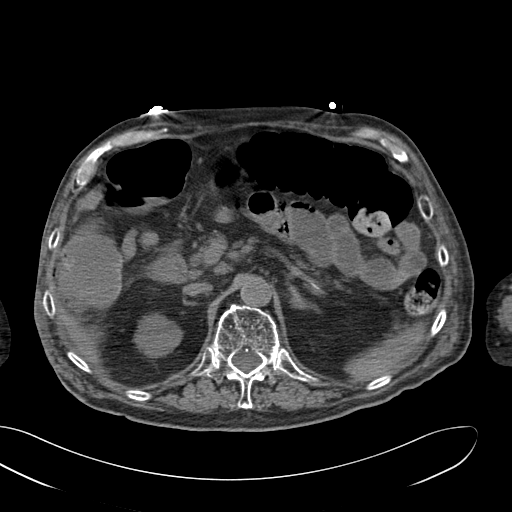
[im 120/167  bone]
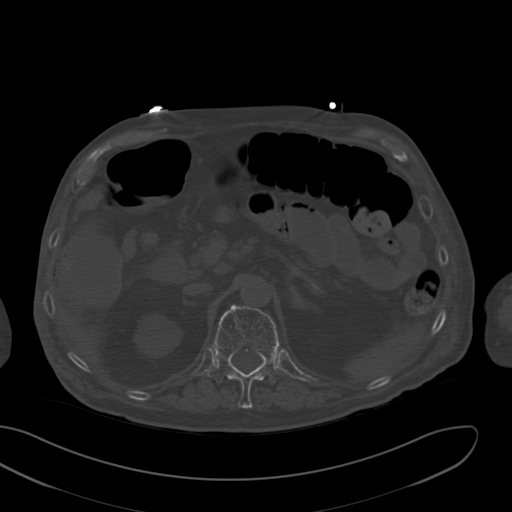
[im 133/167  soft-tissue]
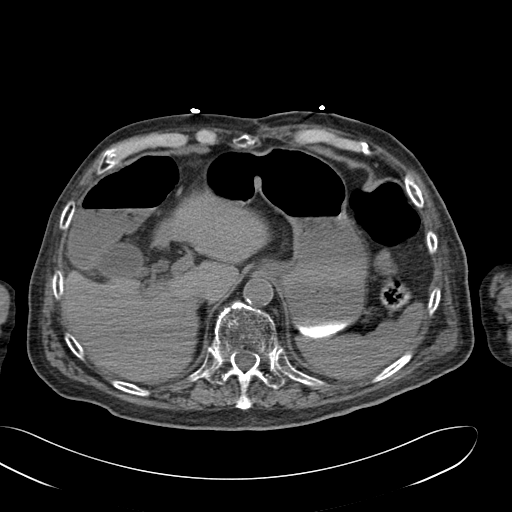
[im 140/167  lung]
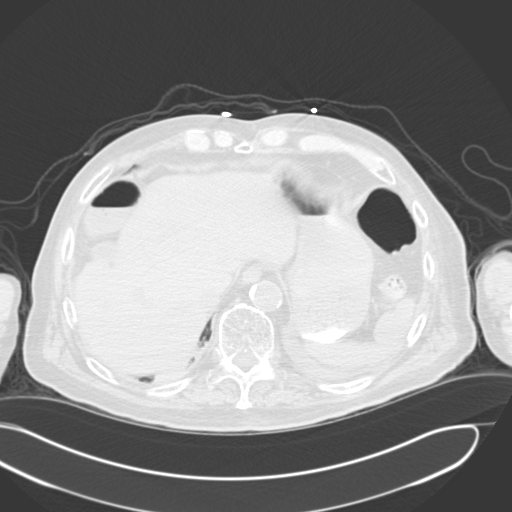
[im 147/167  soft-tissue]
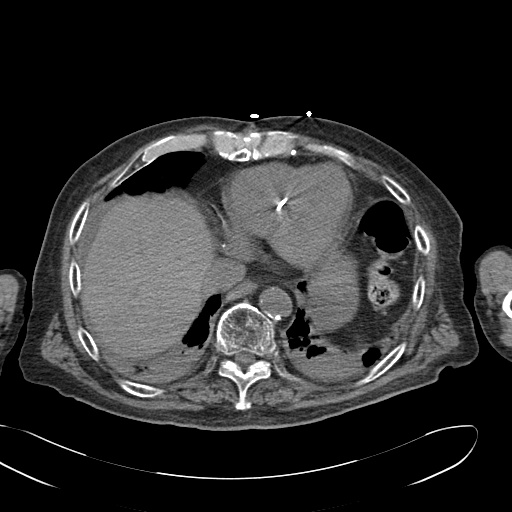
[im 147/167  lung]
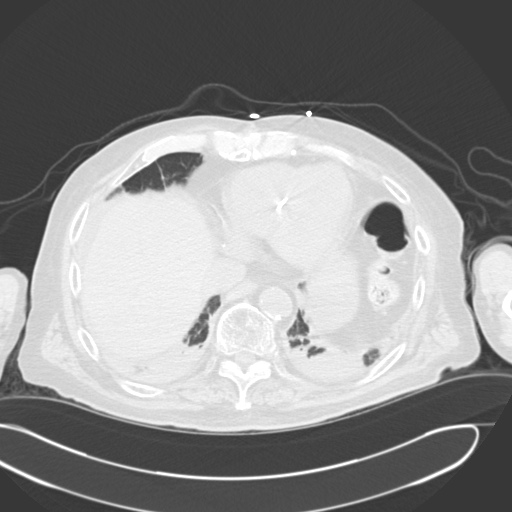
[im 153/167  lung]
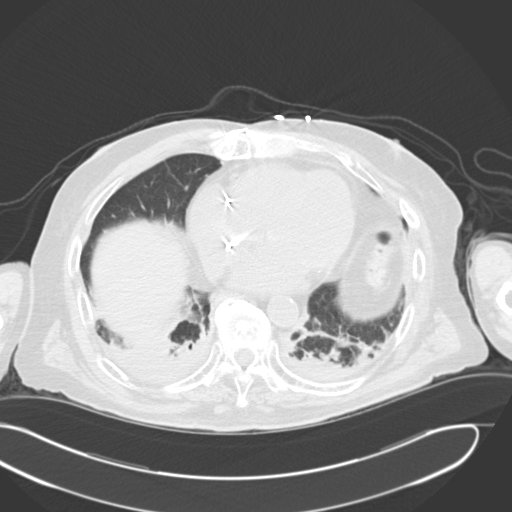
[im 160/167  soft-tissue]
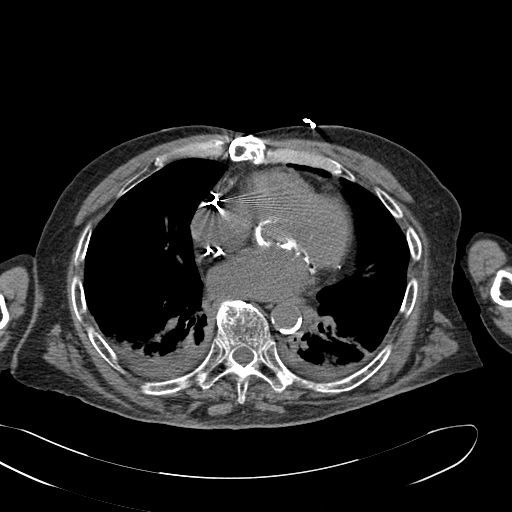
[im 160/167  lung]
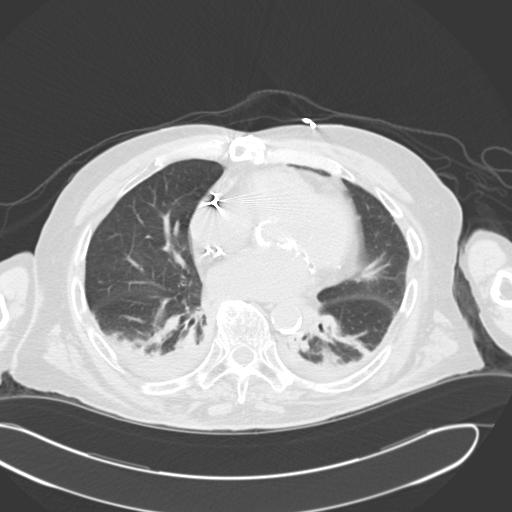

[14 of 32 positions shown; findings below may reference images not displayed]

FINDINGS: There is bibasilar airspace disease which may resent atelectasis versus
pneumonia versus aspiration pneumonia.

No renal, ureteral, or bladder calculi. No obstructive uropathy. No
perinephric stranding is seen. The kidneys are symmetric in size without
evidence for exophytic mass. The bladder is unremarkable.

The liver demonstrates no focal abnormality. The gallbladder is
unremarkable. The spleen demonstrates no focal abnormality. The adrenal
glands and pancreas are normal.

There are multiple air-fluid levels within the colon and with mild colonic
wall thickening involving the ascending colon with pericolonic inflammatory
changes most concerning for colitis. There is circumferential air along the
deep and aspect of the ascending colon and surrounding fecal material likely
representing mottled stool, but pneumatosis involving the proximal half of
the ascending colon cannot be entirely excluded. There is bowel wall
thickening involving the sigmoid colon and rectum with mild surrounding
inflammatory changes. There is fecal impaction within the rectum. There is
no pneumoperitoneum, pneumatosis, or portal venous gas. There is no
abdominal or pelvic free fluid. There is no lymphadenopathy.

The abdominal aorta is normal in caliber with atherosclerosis.

There is multilevel kyphoplasty of the lumbar spine.
IMPRESSION: 1. There are multiple air-fluid levels within the colon and with mild
colonic wall thickening involving the ascending colon with pericolonic
inflammatory changes most concerning for colitis. There is circumferential
air along the deep and aspect of the ascending colon and surrounding fecal
material likely representing mottled stool, but pneumatosis involving the
proximal half of the ascending colon cannot be entirely excluded given the
surrounding inflammatory changes.

2. There is bowel wall thickening involving the sigmoid colon and rectum
with mild surrounding inflammatory changes.

3. Bibasilar airspace disease concerning for atelectasis versus pneumonia.

[REDACTED]

## 2014-02-25 IMAGING — CT CT HEAD WITHOUT CONTRAST
1 series · 16 of 30 positions shown, 20 images · non-contrast
Comparison: none

REASON FOR EXAM: ams
COMMENTS:

PROCEDURE:     CT  - CT HEAD WITHOUT CONTRAST  - July 22, 2012 [DATE]
RESULT:     Comparison:  None
TECHNIQUE: Multiple axial images from the foramen magnum to the vertex were
obtained without IV contrast.

[Series 2: soft tissue · axial · 0.42mm/px · z∈[-134,+16]mm · 16 of 34 slices shown, 20 images]
[im 2/34  brain]
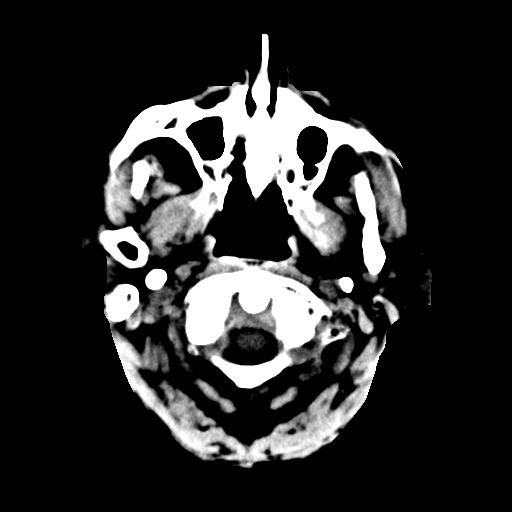
[im 2/34  bone]
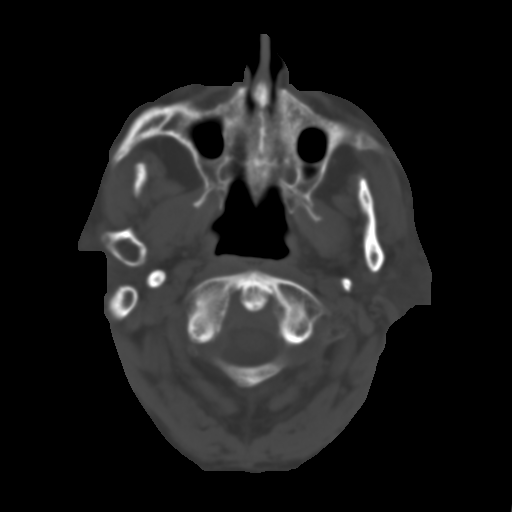
[im 4/34  brain]
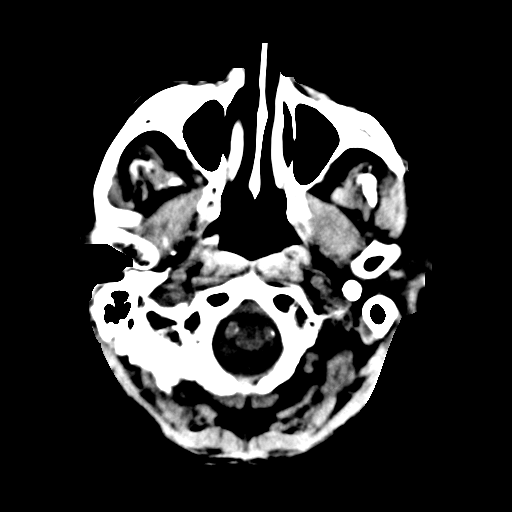
[im 6/34  brain]
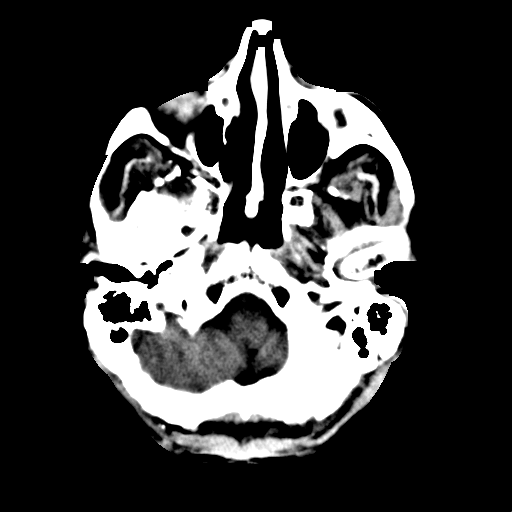
[im 8/34  brain]
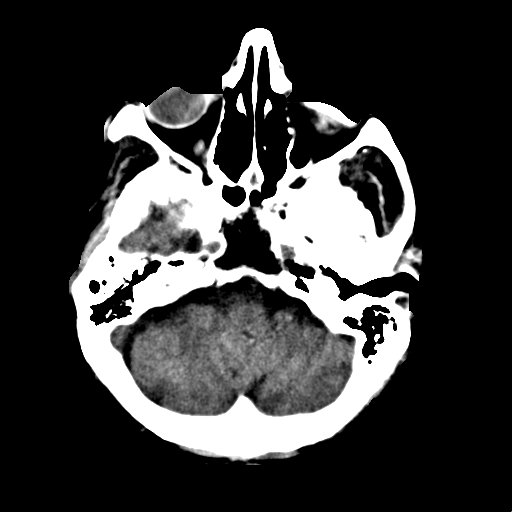
[im 10/34  brain]
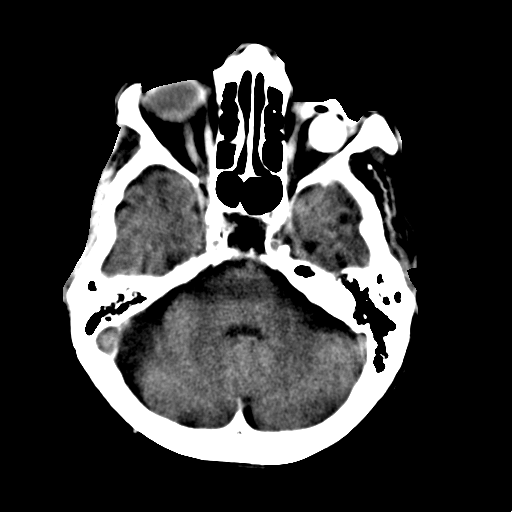
[im 10/34  bone]
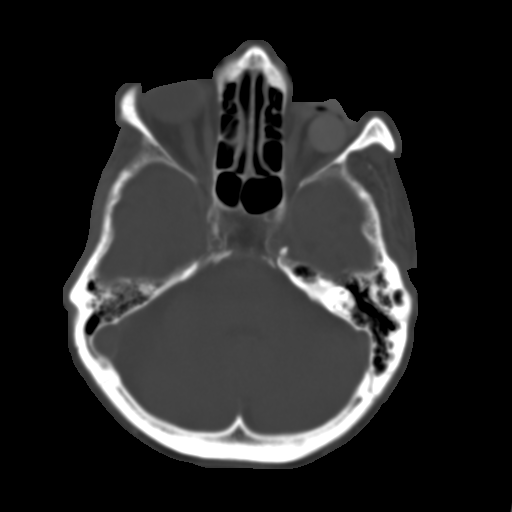
[im 12/34  brain]
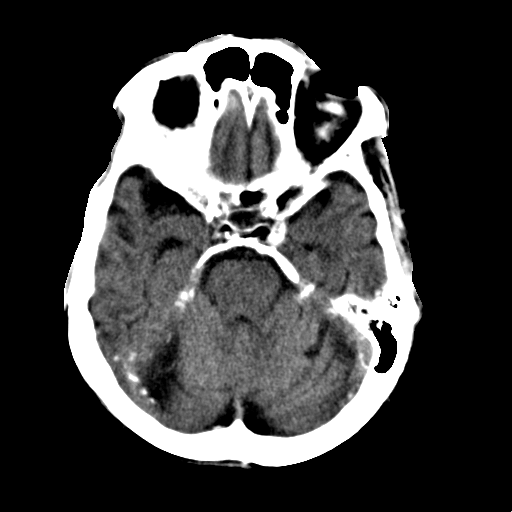
[im 14/34  brain]
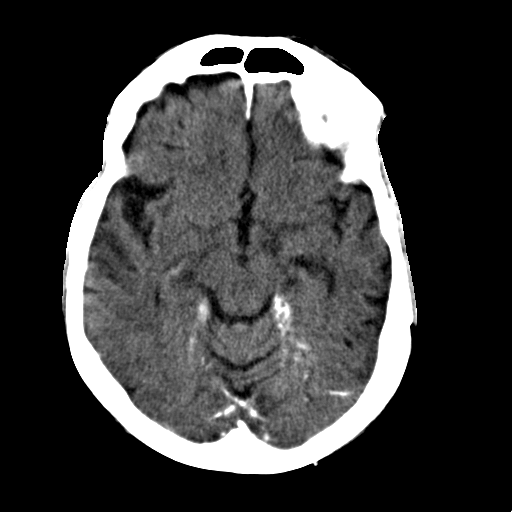
[im 16/34  brain]
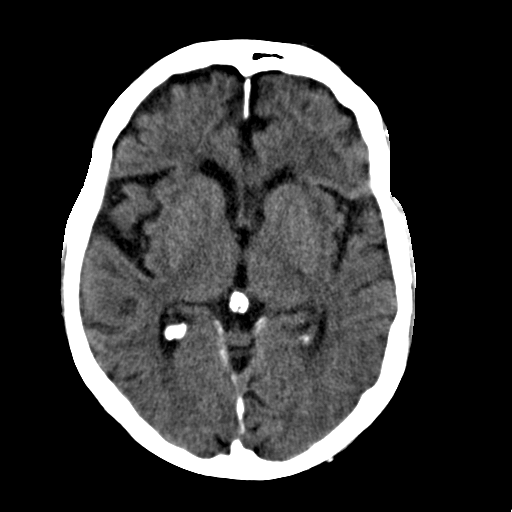
[im 18/34  brain]
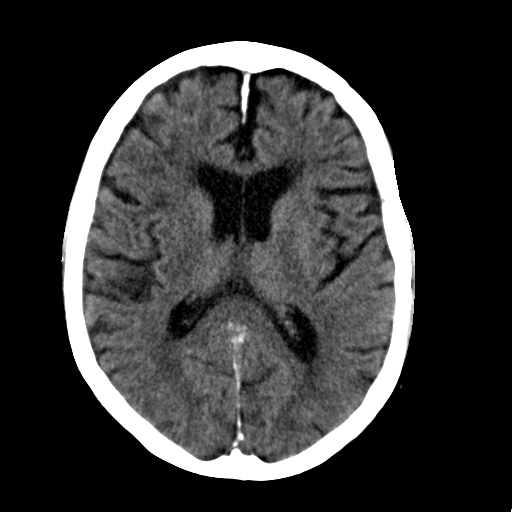
[im 18/34  bone]
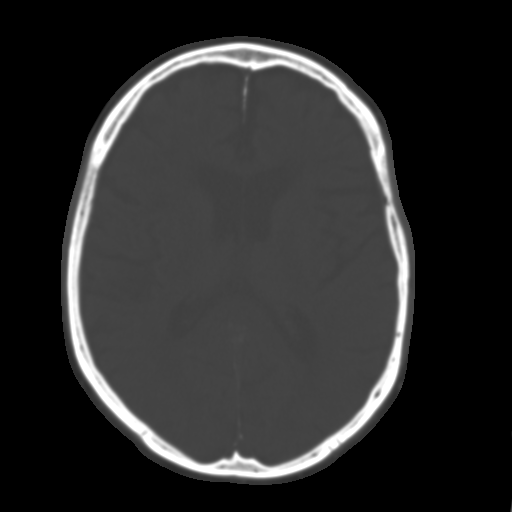
[im 20/34  brain]
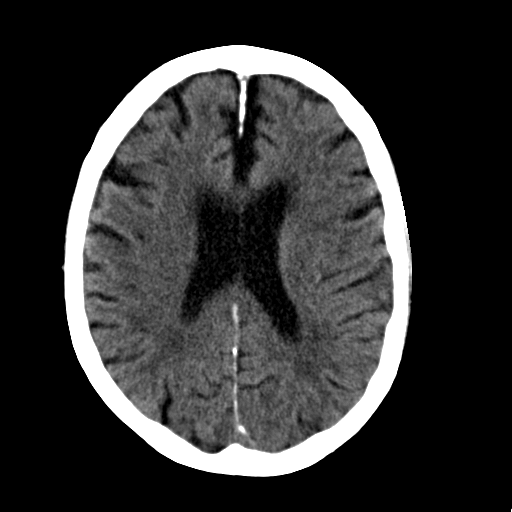
[im 22/34  brain]
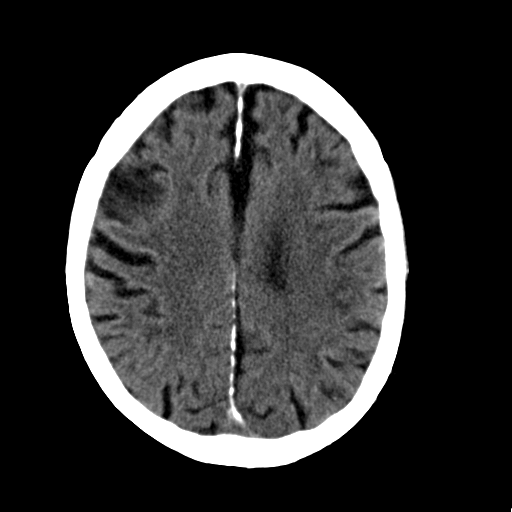
[im 24/34  brain]
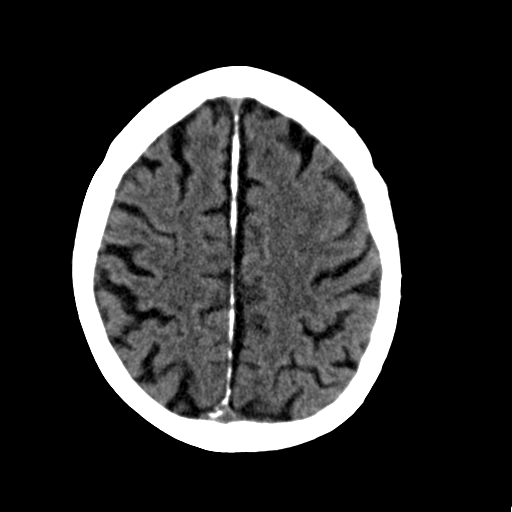
[im 26/34  brain]
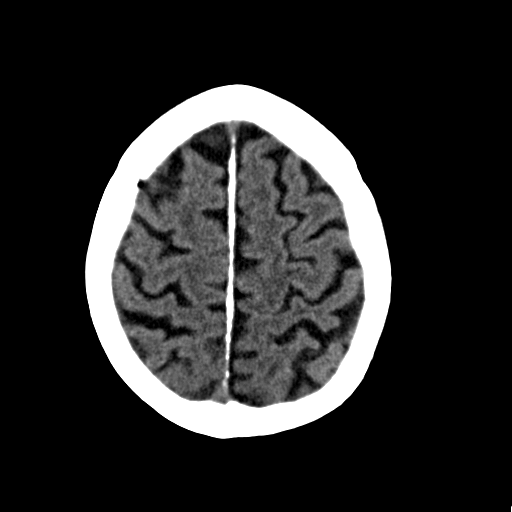
[im 26/34  bone]
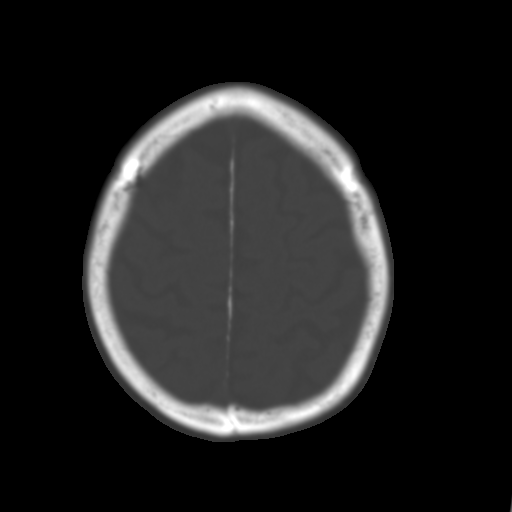
[im 28/34  brain]
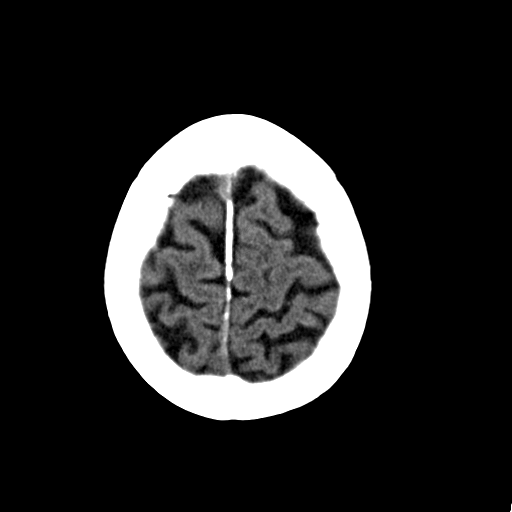
[im 30/34  brain]
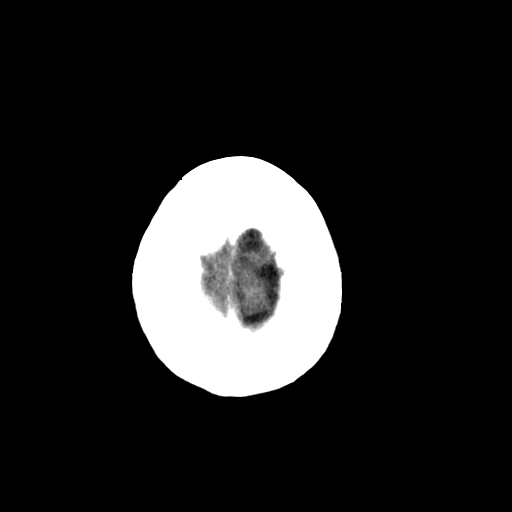
[im 32/34  brain]
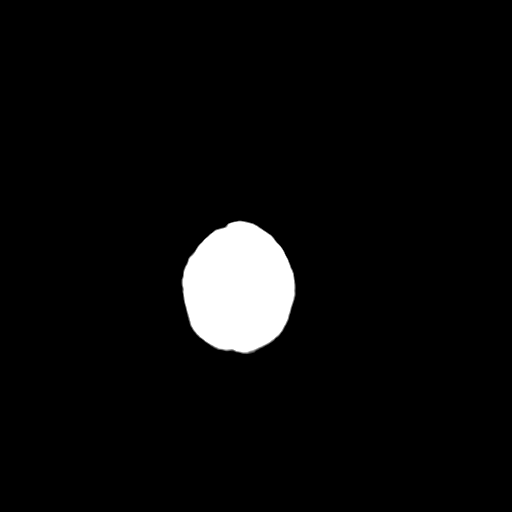

[16 of 30 positions shown; findings below may reference images not displayed]

FINDINGS: There is no evidence of mass effect, midline shift, or extra-axial fluid
collections.  There is no evidence of a space-occupying lesion or
intracranial hemorrhage. There is no evidence of a cortical-based area of
acute infarction. There is generalized cerebral atrophy. There is
periventricular white matter low attenuation likely secondary to
microangiopathy.

The ventricles and sulci are appropriate for the patient's age. The basal
cisterns are patent.

Visualized portions of the orbits are unremarkable. The visualized portions
of the paranasal sinuses and mastoid air cells are unremarkable.
Cerebrovascular atherosclerotic calcifications are noted.

The osseous structures are unremarkable.
IMPRESSION: No acute intracranial process.

[REDACTED]

## 2014-06-04 ENCOUNTER — Inpatient Hospital Stay: Payer: Self-pay | Admitting: Internal Medicine

## 2014-06-04 LAB — CBC
HCT: 30.6 % — ABNORMAL LOW (ref 40.0–52.0)
HGB: 9.6 g/dL — AB (ref 13.0–18.0)
MCH: 30.2 pg (ref 26.0–34.0)
MCHC: 31.5 g/dL — AB (ref 32.0–36.0)
MCV: 96 fL (ref 80–100)
Platelet: 170 10*3/uL (ref 150–440)
RBC: 3.19 10*6/uL — ABNORMAL LOW (ref 4.40–5.90)
RDW: 15.5 % — ABNORMAL HIGH (ref 11.5–14.5)
WBC: 13.8 10*3/uL — ABNORMAL HIGH (ref 3.8–10.6)

## 2014-06-04 LAB — TROPONIN I
Troponin-I: 0.11 ng/mL — ABNORMAL HIGH
Troponin-I: 0.12 ng/mL — ABNORMAL HIGH

## 2014-06-04 LAB — BASIC METABOLIC PANEL
ANION GAP: 12 (ref 7–16)
BUN: 41 mg/dL — AB (ref 7–18)
CALCIUM: 7.9 mg/dL — AB (ref 8.5–10.1)
CO2: 23 mmol/L (ref 21–32)
Chloride: 105 mmol/L (ref 98–107)
Creatinine: 2.53 mg/dL — ABNORMAL HIGH (ref 0.60–1.30)
GFR CALC AF AMER: 31 — AB
GFR CALC NON AF AMER: 25 — AB
GLUCOSE: 228 mg/dL — AB (ref 65–99)
OSMOLALITY: 297 (ref 275–301)
Potassium: 3.9 mmol/L (ref 3.5–5.1)
SODIUM: 140 mmol/L (ref 136–145)

## 2014-06-04 LAB — PRO B NATRIURETIC PEPTIDE: B-Type Natriuretic Peptide: 11162 pg/mL — ABNORMAL HIGH (ref 0–450)

## 2014-06-04 LAB — CK-MB
CK-MB: 0.8 ng/mL (ref 0.5–3.6)
CK-MB: 0.7 ng/mL (ref 0.5–3.6)

## 2014-06-05 LAB — LIPID PANEL
CHOLESTEROL: 108 mg/dL (ref 0–200)
HDL Cholesterol: 6 mg/dL — ABNORMAL LOW (ref 40–60)
Ldl Cholesterol, Calc: 84 mg/dL (ref 0–100)
Triglycerides: 92 mg/dL (ref 0–200)
VLDL CHOLESTEROL, CALC: 18 mg/dL (ref 5–40)

## 2014-06-05 LAB — CBC WITH DIFFERENTIAL/PLATELET
BASOS ABS: 0 10*3/uL (ref 0.0–0.1)
BASOS PCT: 0.2 %
Eosinophil #: 0.1 10*3/uL (ref 0.0–0.7)
Eosinophil %: 0.8 %
HCT: 24.7 % — ABNORMAL LOW (ref 40.0–52.0)
HGB: 8.2 g/dL — AB (ref 13.0–18.0)
LYMPHS ABS: 1.1 10*3/uL (ref 1.0–3.6)
LYMPHS PCT: 12.6 %
MCH: 31.2 pg (ref 26.0–34.0)
MCHC: 33.3 g/dL (ref 32.0–36.0)
MCV: 94 fL (ref 80–100)
MONO ABS: 0.5 x10 3/mm (ref 0.2–1.0)
Monocyte %: 5.5 %
Neutrophil #: 7.4 10*3/uL — ABNORMAL HIGH (ref 1.4–6.5)
Neutrophil %: 80.9 %
PLATELETS: 127 10*3/uL — AB (ref 150–440)
RBC: 2.65 10*6/uL — AB (ref 4.40–5.90)
RDW: 15.5 % — ABNORMAL HIGH (ref 11.5–14.5)
WBC: 9.1 10*3/uL (ref 3.8–10.6)

## 2014-06-05 LAB — BASIC METABOLIC PANEL
ANION GAP: 9 (ref 7–16)
BUN: 43 mg/dL — AB (ref 7–18)
CO2: 25 mmol/L (ref 21–32)
CREATININE: 2.39 mg/dL — AB (ref 0.60–1.30)
Calcium, Total: 7.5 mg/dL — ABNORMAL LOW (ref 8.5–10.1)
Chloride: 105 mmol/L (ref 98–107)
EGFR (African American): 33 — ABNORMAL LOW
EGFR (Non-African Amer.): 27 — ABNORMAL LOW
GLUCOSE: 123 mg/dL — AB (ref 65–99)
Osmolality: 290 (ref 275–301)
Potassium: 3.1 mmol/L — ABNORMAL LOW (ref 3.5–5.1)
Sodium: 139 mmol/L (ref 136–145)

## 2014-06-05 LAB — TROPONIN I: Troponin-I: 0.11 ng/mL — ABNORMAL HIGH

## 2014-06-05 LAB — CK-MB: CK-MB: 0.8 ng/mL (ref 0.5–3.6)

## 2014-06-06 LAB — BASIC METABOLIC PANEL
ANION GAP: 7 (ref 7–16)
BUN: 38 mg/dL — ABNORMAL HIGH (ref 7–18)
CHLORIDE: 111 mmol/L — AB (ref 98–107)
Calcium, Total: 7.8 mg/dL — ABNORMAL LOW (ref 8.5–10.1)
Co2: 24 mmol/L (ref 21–32)
Creatinine: 2.08 mg/dL — ABNORMAL HIGH (ref 0.60–1.30)
EGFR (African American): 38 — ABNORMAL LOW
EGFR (Non-African Amer.): 32 — ABNORMAL LOW
Glucose: 92 mg/dL (ref 65–99)
Osmolality: 292 (ref 275–301)
POTASSIUM: 4 mmol/L (ref 3.5–5.1)
SODIUM: 142 mmol/L (ref 136–145)

## 2014-06-06 LAB — URINALYSIS, COMPLETE
BLOOD: NEGATIVE
Bacteria: NONE SEEN
Bilirubin,UR: NEGATIVE
GLUCOSE, UR: NEGATIVE mg/dL (ref 0–75)
Ketone: NEGATIVE
LEUKOCYTE ESTERASE: NEGATIVE
Nitrite: NEGATIVE
Ph: 5 (ref 4.5–8.0)
Protein: NEGATIVE
RBC,UR: 1 /HPF (ref 0–5)
Specific Gravity: 1.015 (ref 1.003–1.030)
Squamous Epithelial: <1

## 2014-06-07 LAB — LACTATE DEHYDROGENASE, PLEURAL OR PERITONEAL FLUID: LDH, BODY FLUID: 71 U/L

## 2014-06-07 LAB — BODY FLUID CELL COUNT WITH DIFFERENTIAL
Basophil: 0 %
Eosinophil: 0 %
Lymphocytes: 82 %
NEUTROS PCT: 9 %
Nucleated Cell Count: 543 /mm3
Other Cells BF: 0 %
Other Mononuclear Cells: 9 %

## 2014-06-07 LAB — GLUCOSE, SEROUS FLUID: GLUCOSE, BODY FLUID: 128 mg/dL

## 2014-06-07 LAB — CULTURE, BLOOD (SINGLE)

## 2014-06-07 LAB — PROTEIN, BODY FLUID: Protein, Body Fluid: 2.8 g/dL

## 2014-06-09 LAB — CULTURE, BLOOD (SINGLE)

## 2014-06-11 LAB — BODY FLUID CULTURE

## 2014-08-12 ENCOUNTER — Ambulatory Visit: Payer: Self-pay | Admitting: Internal Medicine

## 2014-10-15 ENCOUNTER — Ambulatory Visit
Admit: 2014-10-15 | Disposition: A | Discharge status: Home / Self Care | Payer: Self-pay | Attending: Internal Medicine | Admitting: Internal Medicine

## 2014-11-02 NOTE — H&P (Signed)
    Subjective/Chief Complaint Left hip pain after a fall (Corrected 07/03/12)    History of Present Illness Patient lives at home with his family (daughter and grandchild).  He was trying to get out of bed last night and fell onto his left side.  He was brought to ED and complained of left hip pain.  Had a hip fracture, femoral neck, on right side in 2007 that was initially treated with cannulated screws and later converted to hemiarthroplasty.  Uses a cane for most ambulation.  Primarily a household ambulator with some community ambulation with assistance.   Past Med/Surgical Hx:  MI - Myocardial Infarct:   osteoarthritis:   anemia:   HTN:   right hip  removal of hardware,:   Right hemiarthroplasty, hardware removal Right hip 01/04/06:   Diabetes:   Prosthetic Left Eye:   Appendectomy:   ALLERGIES:  No Known Allergies:   Family and Social History:   Family History Non-Contributory    Social History negative tobacco    Place of Living Lives with family - see HPI   Review of Systems:   Subjective/Chief Complaint left hip pain    Fever/Chills No    Cough No    Sputum No    Abdominal Pain No    Diarrhea No    Constipation occasional    Nausea/Vomiting No    SOB/DOE No    Chest Pain No    Telemetry Reviewed restart ASA post-op    Dysuria denies    Tolerating Diet Yes   Physical Exam:   GEN well developed, thin    HEENT pink conjunctivae, Right eye difficulty keeping open - baseline    NECK supple  No masses  trachea midline    RESP normal resp effort  clear BS  no use of accessory muscles    CARD regular rate    ABD denies tenderness  hypoactive BS    GU foley catheter in place    LYMPH inguinal negative    EXTR negative cyanosis/clubbing, left leg slightly externally rotated.  in [place] traction 5lb.  DPN, SPN, Tib n intact to light touch.  TA and gs intact.  moves toes to command.  no lesions on hip.  DP 2+    SKIN normal to palpation, dry,  compromised turgor    PSYCH poor insight, lethargic     Assessment/Admission Diagnosis Left hip basicervical/IT fracture, non-displaced.    Plan Cleared for OR tomorrow.  Plan for IM nail placement.  Discussed with patient and family high risk fo mortality after hip fracture both with or without surgery.  Discussed high risk of peri-op mortality.  Confirmed code status.  Discussed functional decline to be expected after hip fracture.  Family shows understanding and wishes to proceed.  Plan for Thursday a.m.   Electronic Signatures: Murlean Harkamasunder, Anushka Hartinger (MD)  (Signed 19-Dec-13 09:42)  Authored: CHIEF COMPLAINT and HISTORY, PAST MEDICAL/SURGIAL HISTORY, ALLERGIES, HOME MEDICATIONS, FAMILY AND SOCIAL HISTORY, REVIEW OF SYSTEMS, PHYSICAL EXAM, ASSESSMENT AND PLAN   Last Updated: 19-Dec-13 09:42 by Murlean Harkamasunder, Constant Mandeville (MD)

## 2014-11-05 NOTE — Consult Note (Signed)
Brief Consult Note: Diagnosis: possible colon ischemia.   Patient was seen by consultant.   Consult note dictated.   Orders entered.   Comments: Abdomen certainly quite tender, but no ??? WBC or acidosis to suggest bowel ischemia. Pt's daughter is a good Runner, broadcasting/film/videosurrogate decision maker and clearly states that he would not wish to have agressive surgery (requiring a colostomy), or more than a few days of ventilator therapy. Therefore, surgery is not an option in this debilitated 81 year old. I have D/C-ed his CaCO3 and ordered Entereg in addition to the Lactulose he is on in hopes that his pain and tenderness is related to constipation. Continue comfort measures and analgesics. Will follow.  Electronic Signatures: Claude MangesMarterre, Nishaan Stanke F (MD)  (Signed 08-Jan-14 20:22)  Authored: Brief Consult Note   Last Updated: 08-Jan-14 20:22 by Claude MangesMarterre, Iyanah Demont F (MD)

## 2014-11-05 NOTE — H&P (Signed)
PATIENT NAME:  Trevor [name], Trevor [name] MR#:  [id number] DATE OF BIRTH:  1918-01-14  DATE OF ADMISSION:  07/22/2012  PRIMARY CARE PHYSICIAN: Trevor CrowMarshall Anderson, MD   CHIEF COMPLAINT: Abdominal pain, constipation.   HISTORY OF PRESENT ILLNESS: The patient is [name] who was recently admitted to the hospital with [name] left hip fracture and [facility] to [name] and presents with abdominal pain, decreased appetite and constipation. History is taking by the family, who is at bedside. The patient is hard of hearing and is not [name] very good historian. According to the family, the patient has been doing well. He was actually ambulating with physical therapy at the rehab up until 2 days ago when he has had decreased appetite. He was severely constipated. At the facility, they have been giving him medications for his constipation.  He had [name] bowel movement 2 days ago, and after that he had nausea and is complaining of abdominal pain. So, they brought him here for further evaluation predominantly, also due to the decreased appetite. In the ER, the CT scan of the abdomen showed some colitis, possible pneumatosis. His last bowel movement, as mentioned, was 2 nights ago.  He has continued to have some abdominal pain without any rebound or guarding.   REVIEW OF SYSTEMS: Unable to obtain. The patient is hard of hearing, and also he is confused.   PAST MEDICAL HISTORY:  1. History of coronary artery disease and MI.  2. Osteoarthritis. 3. Anemia of chronic disease.  4. Chronic renal failure, stage III, with [name] baseline creatinine 1.7 to 2.0.  5. Hypertension.  6. Diabetes.  7. He is hard of hearing.  8. History of congestive heart failure with an EF of 25 to 35%.  MEDICATIONS: 1. Acetaminophen/hydrocodone 325/10, 3 to 4 hours p.r.n. pain.  2. Allopurinol 100 mg 1/2 tablet daily.  3. Aspirin 81 mg daily.  4. Docusate 100 mg daily.  5. Ferrous sulfate 325 b.i.d.  6. Glipizide 5 mg daily.   7. Lunesta 2 mg at  bedtime.  8. Metoprolol 2.5 b.i.d.  9. Oxycodone 5 mg q. 3 to 4 hours p.r.n. pain.  10. Oyster shell calcium with vitamin D 500/200 b.i.d.  11. Vitamin D3 1000 international units 2 tablets daily.   PAST SURGICAL HISTORY: 1. Appendectomy.  2. Left prosthetic eye.  3. Left hip nailing and pinning.  4. Right hip hemiarthroplasty.   ALLERGIES: No known allergies.   SOCIAL HISTORY: [facility]. No smoking, or IV drug use or alcohol.   FAMILY HISTORY: Unknown.   PHYSICAL EXAMINATION: VITAL SIGNS: The patient is afebrile with [name] pulse of 96, respirations 18, blood pressure 130/78, 96% on room air.  GENERAL: The patient is alert. He is oriented only to name. He is very hard of hearing.  HEENT: Head is atraumatic. He is blind in the left eye.  He has got [name] prosthetic eye. Oropharynx is clear. Mucous membranes are very dry.  NECK: Supple without JVD, carotid bruit, enlarged thyroid. CARDIOVASCULAR: Regular rate and rhythm. No murmurs, gallops, or rubs. PMI is hard to appreciate.  LUNGS: Clear to auscultation without crackles, rales, rhonchi, or wheezing.  ABDOMEN: He has got diffuse tenderness without any rebound or guarding. Predominantly, his tenderness is at the lower abdomen. Bowel sounds are positive.  EXTREMITIES: No cyanosis, clubbing or edema. NEUROLOGIC: The patient is very hard of hearing, and it hard to do [name] good neuro exam.  He has no focal deficits.  SKIN: Without rashes or lesions.   LABORATORY, DIAGNOSTIC AND RADIOLOGICAL DATA:  White blood cells 9.0, hemoglobin 9.2, hematocrit 33, platelets are 192. Sodium 132, potassium 5.2, chloride 98, bicarbonate 23, BUN 84, creatinine 2.61, glucose 215, bilirubin 1.5, alkaline phosphatase 183, ALT is 10, AST is 24, total protein 6.1, albumin 2.4, INR 1.1. Troponin 0.06. TSH is 2.92. Urinalysis shows no leukocyte esterase or nitrites.  Chest x-ray shows possible blunting of the left costophrenic angle, suggests  pleural thickening. CT of the head shows no acute intracranial hemorrhage or CVA.  CT of the abdomen shows multiple air-fluid levels within the colon and mild colonic wall thickening involving the ascending colon with pericolonic inflammatory changes most concerning for colitis. There is circumferential air along the distal aspect of the ascending colon and surrounding fecal material but pneumatosis involving the proximal half of the ascending colon cannot be entirely excluded given the surrounding inflammatory changes. There is bowel wall thickening involving the sigmoid colon and rectum with mild surrounding inflammatory changes, and there is atelectasis bilaterally versus air space disease.  EKG shows AV paced rhythm  ASSESSMENT/PLAN: The patient is [name] who presents with abdominal pain, with an abnormal CT scan and colitis, as well as acute renal failure.   1. Abdominal pain with [name] CT scan showing colitis along with constipation possible pneumatosis: The patient will be admitted to Med/Surg. We will continue pain medications and aggressive bowel regimen including MiraLax, lactulose, docusate and Senna. I will also go ahead and consult Surgery for the abnormal CT scan with possible pneumatosis. I will monitor abdominal serial exams.  2. Hyponatremia secondary to dehydration and acute renal failure: TSH is normal. Provide very gentle IV fluids due to the history of CHF and monitor sodium closely.  3. Acute renal failure secondary to dehydration and possible constipation:  We will monitor carefully. His baseline creatinine is 1.7 to 2.0.  No Renal consult at this time; however, if creatinine continues to increase, may consider Renal consult.  4. Elevated troponin: Likely secondary to acute renal failure with poor clearance. We will continue to follow.  The patient is not [name] very good historian to assess if he has had chest pain.   CODE STATUS: The patient is [name] DNR STATUS.       TIME  SPENT: Approximately 40 minutes.   ____________________________ Janyth Contes. Juliene Pina, MD spm:cb D: 07/22/2012 15:30:07 ET T: 07/22/2012 16:53:55 ET JOB#: [id number]  cc: Shyra Emile P. Juliene Pina, MD, <Dictator> Marya Amsler. Dareen Piano, MD Janyth Contes Heather Streeper MD ELECTRONICALLY SIGNED 07/22/2012 18:24

## 2014-11-05 NOTE — Op Note (Signed)
PATIENT NAME:  Trevor [name], Trevor [name] MR#:  [id number] DATE OF BIRTH:  1918/04/27  DATE OF PROCEDURE:  07/03/2012  PREOPERATIVE DIAGNOSIS:  Left hip intertrochanteric fracture.   POSTOPERATIVE DIAGNOSIS:  Left hip intertrochanteric fracture.   PROCEDURE PERFORMED: Left hip intramedullary nail fixation of stable intertrochanteric fracture.   SURGEON:  Marliss Coots, MD   ASSISTANT:  April Berndt, NP  ANESTHESIA:  Spinal anesthesia and general anesthesia via LMA.   ESTIMATED BLOOD LOSS:  100 mL.   URINE OUTPUT:  150 mL.   IMPLANTS:  Biomet AFFIXUS hip fracture nail 130 degrees, 11 mm x 400 mm, and AFFIXUS hip fracture nail lag screw 10.5 mm x 100 mm.   COMPLICATIONS:  No immediate intraoperative or postoperative complications were noted.   DISPOSITION:  The patient was awoken and taken to the postoperative care unit. He was in stable condition. He will be made weight-bearing as tolerated. He will be placed on 24 hours of prophylactic antibiotics. He will be placed on Lovenox for DVT prophylaxis.   INDICATIONS FOR PROCEDURE:  The patient is a 81 year old gentleman who had fallen multiple times in the past several days. Most recent fall occurred on Monday. The patient was brought into the hospital for hip pain and inability to bear weight. He was found to have a stable intertrochanteric pattern fracture of the left hip. He was evaluated by the medical team and cleared for surgery. The left lower extremity was marked as the operative site. Informed consent was obtained by the patient and his daughter who is his power of attorney. The risks and benefits of surgery were explained to the patient and his family. The risks and benefits of nonoperative intervention were also explained.   DESCRIPTION OF PROCEDURE:  The patient was brought into the operating room. Spinal anesthesia was administered. He was placed on the operating room table in a supine position. Please note that in the midst of the procedure the  decision was made to administer general anesthesia via LMA.   The patient was placed on a fracture table. A post was secured between the patient's legs and a seatbelt was placed across the torso. The left lower extremity was placed in a traction boot and the right lower extremity was carefully placed in a well-padded well-leg holder. Fluoroscopic views were obtained confirming reduction of the fracture on both AP and lateral views.   The left hip was prepared and draped in the usual sterile fashion. A surgical timeout was performed confirming the patient, the procedure, the operative site, the imaging and the administration of preoperative antibiotics.   Under fluoroscopic guidance, the tip of the greater trochanter was palpated and marked. The shaft of the femur was marked. A 3 cm incision was made proximal to the tip of the greater trochanter. Sharp dissection was carried down through the fascia. Blunt dissection was carried down through the muscle. Using a guidewire, a starting point on the tip of the greater trochanter was confirmed on orthogonal C-arm images. The guidewire was advanced and placement was confirmed. Using an opening reamer, the cortex of the greater trochanter was opened. At this time, the guidepin was removed and a ball-tipped guidewire with a slightly bent tip was inserted into the femur. Placement of this wire was confirmed on orthogonal radiographs to the length of the femur. The length of the wire was measured and found to be 410 mm. Decision was made to use a 400 mm nail. Reaming of the femoral canal was begun at 11  mm. The femur was reamed sequentially to a width of 13 mm. At this time, an 11 mm x 400 mm nail was inserted over the ball-tipped guidewire. Placement of the nail was confirmed on orthogonal radiographs at the knee and at the hip. Confirmation of placement of the hip screw nail was done. The hip screw guide was then inserted and a small more distal incision was made. The  fascia was sharply incised and muscle was split bluntly. The guide was pushed down to bone. Using the goal post guide on lateral view, good position for the screw was determined.   The guidepin was inserted and measured to be 100 mm. The guidepin was then overdrilled in preparation for placement of a 10.5 mm screw. A 10.5 mm x 100 mm screw was inserted. Care was taken throughout insertion of the screw to ensure that the guidepin did not advance. At this time, the guidepin was removed and final placement of the screw was confirmed. The set screw was then depressed and one-quarter reverse turn was made. The fracture demonstrated excellent alignment and therefore compression across the fracture site was not necessary. Final orthogonal radiographs were obtained at both the hip and the knee demonstrating the hardware in good position and good alignment of the fracture.   The 2 wounds were copiously irrigated with sterile saline. Both wounds fascia was closed using 0 Vicryl suture. Subcutaneous tissue was closed using 2-0 Vicryl suture. Skin was closed using staples. Sterile dressings were applied. The patient was carefully taken down from the traction setup and the bottom of the bed was replaced. The patient was transferred to the hospital bed and awoken.   He will be weight-bearing as tolerated postoperatively. Physical therapy will be ordered. He will have Lovenox for DVT prophylaxis and have 24 hours of antibiotic prophylaxis.   ____________________________ Murlean HarkShalini Catlin Doria, MD sr:si D: 07/03/2012 13:52:44 ET T: 07/03/2012 23:43:51 ET JOB#: [id number]  cc: Murlean HarkShalini Jatniel Verastegui, MD, <Dictator> Murlean HarkSHALINI Andri Prestia MD ELECTRONICALLY SIGNED 07/17/2012 21:23

## 2014-11-05 NOTE — Consult Note (Signed)
PATIENT NAME:  Trevor [name], Trevor [name] MR#:  [id number] DATE OF BIRTH:  Oct 17, 1917  DATE OF CONSULTATION:  07/23/2012  REFERRING PHYSICIAN:   CONSULTING PHYSICIAN:  Trevor Manges, MD  REASON FOR CONSULTATION: Possible colon ischemia.   PRIMARY CARE PHYSICIAN: Einar Crow.   HISTORY OF PRESENT ILLNESS: The patient is a 81 year old white male who was recently hospitalized for a left hip fracture and was discharged to a nursing home when he was ambulating with physical therapy assistance and developed an abdominal pain, decreased appetite and constipation. He was given medication for his constipation and had a bowel movement 2 days prior to admission (07/20/2012) and continued intermittent complaints of abdominal pain interspersed between times of confusion where he was comfortable. He was admitted to the hospital yesterday and on CT scan examination of the abdomen and pelvis without contrast, he was found to have multiple air-fluid levels within the colon with mild ascending colon wall thickening and pericolonic inflammatory changes as well as some circumferential air that likely represented mottled stool, but pneumatosis intestinalis could not be ruled out. There was also bowel wall thickening involving the sigmoid colon and rectum with severe constipation. There was no pneumoperitoneum, obvious pneumatosis or portal venous gas. Since hospitalization, the patient had 2 bowel movements yesterday (both small) and 2 bowel movements today (both medium and soft and dark brown) and he has continued to be confused due to his narcotics (according to his daughter, who is in the room with him), but occasionally does complain of abdominal pain as the IV morphine is "wearing off." He has been afebrile and has been on lactulose b.i.d. but is also taking calcium carbonate. He has been treated during this hospitalization with IV Invanz.   PAST MEDICAL HISTORY: 1.  History of coronary artery disease, myocardial  infarction and open heart surgery.  2.  Chronic renal failure stage III with baseline creatinine 1.7 to 2.0.  3.  Hypertension.  4.  Osteoarthritis.  5.  Anemia of chronic disease.  6.  Diabetes mellitus.  7.  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  8.  Blindness of the right eye.  9.  Congestive heart failure with ejection fraction 25% to 35%.   MEDICATIONS AT THE TIME OF ADMISSION: Acetaminophen/hydrocodone 325/10 q.3 to 4 hours p.r.n. pain, allopurinol 50 mg daily, aspirin 81 mg daily, docusate 100 mg daily, ferrous sulfate 325 mg b.i.d., glipizide 5 mg daily, Lunesta 2 mg at bedtime, metoprolol 2.5 mg b.i.d., oxycodone 5 mg q.3 to 4 hours p.r.n., oyster shell calcium carbonate with vitamin D 500/200 b.i.d.,  vitamin D3 1000 international units 2 tablets daily.   PAST SURGICAL HISTORY:  1.  Open-heart surgery via median sternotomy of unknown type.  2.  Appendectomy.  3.  Left prosthetic eye.   4.  Left hip nailing and pinning.  5.  Right hip hemiarthroplasty.  6.  Skin graft to right lower quadrant abdominal wall.   ALLERGIES: None.   REVIEW OF SYSTEMS: Cannot be obtained as the patient is essentially unresponsive.   SOCIAL HISTORY: The patient is at [facility] rehabilitation center. He does not smoke or use alcohol. I reviewed his goals of care with his daughter and she confirmed that he does not wish to be resuscitated or to have an attempted resuscitation in the event cardiorespiratory arrest. Furthermore, he would prefer to get better but understands that if it is "his time," that he would prefer to die comfortably and naturally. He  specifically would be opposed to hemodialysis of any duration or mechanical ventilation for longer than 3 or 4 days. He also would specifically be opposed a feeding tube for longer than week or 2, and his daughter confirmed the fact that he would be opposed to colostomy or any highly interventional surgery for which he would  require ICU care.   FAMILY HISTORY: Noncontributory.   PHYSICAL EXAMINATION: GENERAL: Reveals an elderly disoriented man who is lying in bed and pulling his hospital gown off, and essentially does not respond to me.  VITAL SIGNS: Height 6 feet, 0 inches, weight 170 pounds, BMI 23.1. Temperature 98.0 (he has been afebrile during this hospitalization), pulse 85, respirations 20, blood pressure 111/69, oxygen saturation 94% on room air at rest.  HEENT:  Prosthetic left eye with minimal reaction of the left pupil.  OROPHARYNX: Clear. Mucous membranes are dry. The patient does not appear to hear me despite being on the patient's right side, where his hearing aid is in place.  NECK: No jugular venous distention or tracheal deviation.  HEART: Regular rate and rhythm with no murmurs or rubs.  LUNGS: Clear to auscultation with normal respiratory effort bilaterally.  ABDOMEN: Abdomen is nondistended but diffusely tender. The patient grabs my hand when I try to examine him.  EXTREMITIES: No edema with normal capillary refill bilaterally.  NEUROLOGIC: Neuro examination is difficult to ascertain but he has no focal deficits in either upper or lower extremities.  PSYCHIATRIC: The patient is utterly confused and cannot respond for himself, but his daughter is present and is quite capable of being a surrogate Management consultantdecision maker.   LABORATORY DATA:  Blood glucose about 200, BUN 83, creatinine about 2.5, sodium today 135, potassium 4.5, chloride 101, CO2 24, anion gap normal, calcium normal, albumin 2.2, bilirubin 1.1, with remainder of his liver function tests being normal. TSH normal. CK, CPK-MB and troponins all normal. White blood cell count 7.5, hemoglobin 10.0, hematocrit 30%, platelet count 174,000. PT 15, INR 1.1. PTT 31.2. Urinalysis is clear x 2. CT scan of the abdomen and pelvis without contrast is as described in the history of present illness.   ASSESSMENT: Possible colon ischemia. The patient's daughter  was clear as his surrogate decision maker that he would not wish to have extensive surgery which required intubation and likely postoperative ICU care, and would certainly not consent to a colostomy. Since colostomy would be required if, in fact, he has colonic ischemia, I suggested that we maintain his comfort level with narcotics as we are doing and continue the lactulose. I will add Entereg and I have discontinued his calcium carbonate which is constipatory. I will see him again tomorrow evening.      ____________________________ Trevor MangesWilliam F. Gwenneth Whiteman, MD wfm:cs D: 07/23/2012 20:18:28 ET T: 07/23/2012 21:44:26 ET JOB#: [id number]  cc: Trevor MangesWilliam F. Skyelyn Scruggs, MD, <Dictator> Trevor MangesWILLIAM F Kedarius Aloisi MD ELECTRONICALLY SIGNED 07/24/2012 19:41

## 2014-11-05 NOTE — Consult Note (Signed)
PATIENT NAME:  Trevor [name], TALCOTT MR#:  [id number] DATE OF BIRTH:  [birth year]  DATE OF CONSULTATION:  07/02/2012  REFERRING PHYSICIAN: Murlean Hark, MD   CONSULTING PHYSICIAN:  Herschell Dimes. Renae Gloss, MD PRIMARY CARE PHYSICIAN: Neomia Dear. Harrington Challenger, MD   REASON FOR CONSULTATION: Preop and postoperative management for hip fracture.   HISTORY OF PRESENT ILLNESS: This is a 81 year old man with multiple medical problems. He presented today after he got out of bed and had a fall. He was walking without his cane. His leg hurt, and they brought him in and he was found to have a hip fracture. History is difficult with this patient secondary to difficulty hearing, and the patient was also confused after IV pain medications. Family at the bedside is helping out with history and also history from old chart. He lives with his daughter, usually walks with a cane. He does take pain medications that recently have been increased and have been causing confusion at times. Most likely this is a mechanical fall. Hospitalist services were contacted for preoperative and postoperative management.   PAST MEDICAL HISTORY: Hypertension, hyperlipidemia, diabetes, anemia, coronary artery disease, status post bypass grafting in 2012 and aortic valve replacement at the same time, history of complete heart block, status post pacemaker, chronic obstructive pulmonary disease, chronic kidney disease stage III, chronic low back pain.   PAST SURGICAL HISTORY: Coronary artery bypass grafting and valve replacement at the same time, appendectomy, right arm reconstructive surgery, pacemaker, right hip replacement, left eye prosthesis.   ALLERGIES: No known drug allergies.   MEDICATIONS: As per Prescription Writer: Acetaminophen/hydrocodone 325/10, 1 tablet every 6 hours as needed for pain, allopurinol 100 mg, 1/2 tablet daily, aspirin 81 mg daily, Colace 100 mg daily, glipizide 5 mg daily, Lunesta 2 mg at bedtime, metoprolol 25 mg, 1/2 tablet every  12 hours.   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 A daughter lives with him. No smoking or alcohol use in many many, many years.   FAMILY HISTORY: Mother died of stomach cancer. Father died of possible ruptured appendix.  REVIEW OF SYSTEMS: Difficult to obtain secondary to encephalopathy.   PHYSICAL EXAMINATION: VITAL SIGNS: Temperature 98.1, pulse 73, blood pressure 142/72, pulse oximetry 97% on 3 liters.  GENERAL: No respiratory distress.  HEENT: Eyes: Conjunctivae normal. Right pupil round and reactive to light. Left pupil is a prosthesis. Ears, nose, mouth, and throat: Tympanic membranes no erythema. Nasal mucosa no erythema. Throat no erythema. No exudate seen. Lips and gums no lesions.  NECK: No JVD. No bruits. No lymphadenopathy. No thyromegaly. No thyroid nodules palpated.  RESPIRATORY: Decreased breath sounds bilaterally. No rhonchi, rales, or wheeze heard.  CARDIOVASCULAR: S1, S2 normal. No gallops, rubs, or murmurs heard. Carotid upstroke 2+ bilaterally. No bruits. Dorsalis pedis pulses 1+ bilaterally. Trace edema of the lower extremity.  ABDOMEN: Soft, nontender. No organomegaly or splenomegaly. Normoactive bowel sounds. No masses felt.  LYMPHATIC: No lymph nodes in the neck.  MUSCULOSKELETAL: No clubbing, edema, or cyanosis.  SKIN: No rashes or ulcers seen.  NEUROLOGICAL: Cranial nerves are difficult to test secondary to altered mental status, but the patient is talking and moving his upper extremities.  PSYCHIATRIC: The patient is alert, oriented to person.   LABORATORY AND RADIOLOGICAL DATA:   Left hip showed findings worrisome for a nondisplaced intertrochanteric fracture of the left proximal femur. Chest x-ray showed findings consistent with congestive heart failure superimposed on pulmonary fibrosis. No focal pneumonia seen.   Glucose 107, BUN 33,  creatinine 1.69, sodium 141, potassium 4.9, chloride 109, CO2 27, calcium 8.8, alkaline phosphatase 171, ALT  14, AST 31, albumin 2.8. White blood cell count 5.4, hemoglobin and hematocrit 10.3 and 32.3, platelet count of 124.0.  INR 0.9.   ASSESSMENT AND PLAN: 1. Preop medical evaluation for hip fracture, mechanical fall: The patient is a moderate risk for surgery with age and history of coronary artery disease, can proceed with surgery, already on preop beta blocker. Restart aspirin as soon as possible after the surgery. EKG is reviewed and it is paced. The patient would like to be a DO NOT RESUSCITATE after the surgery, FULL CODE in surgery.  2. Encephalopathy:  The patient does have periods of confusion even at home and has chronic pain meds for the back. He received IV pain medications here. After surgery, I would recommend switching off IV pain medications as soon as possible.  3. Hypertension: Blood pressure currently stable.  4. History of congestive heart failure: Currently no signs, but chest x-ray is concerning. We will stop fluid and give a dose of Lasix.  5. Diabetes: We will hold glipizide, sliding scale only.  6. Gout: Continue allopurinol.  7. History of chronic obstructive pulmonary disease, currently on 3 liters of oxygen: Continue to watch respiratory status closely.    TIME SPENT ON CONSULTATION: 55 minutes.   ____________________________ Herschell Dimesichard J. Renae GlossWieting, MD rjw:cb D: 07/02/2012 15:49:12 ET T: 07/02/2012 16:42:53 ET JOB#: [id number]  cc: Herschell Dimesichard J. Renae GlossWieting, MD, <Dictator> Neomia Dearavid N. Harrington Challengerhies, MD Murlean HarkShalini Ramasunder, MD Salley ScarletICHARD J Joahan Swatzell MD ELECTRONICALLY SIGNED 07/17/2012 12:39

## 2014-11-05 NOTE — Discharge Summary (Signed)
PATIENT NAME:  Trevor [name], Trevor [name] MR#:  [id number] DATE OF BIRTH:  Feb 20, 1918  DATE OF ADMISSION:  07/02/2012 DATE OF DISCHARGE:  07/07/2012  ADMITTING DIAGNOSIS: Left intertrochanteric fracture.   DISCHARGE DIAGNOSIS: Left intertrochanteric fracture.   OPERATION: On 07/03/2012, the patient had left hip intramedullary nail.   SURGEON: Dr. Claris Gladden.  ANESTHESIA: Spinal anesthesia with general anesthesia via LMA.  ESTIMATED BLOOD LOSS: 100 mL.  URINE OUTPUT: 150 mL.  DRAINS: Hemovac.   COMPLICATIONS: None.   IMPLANTS USED: Biomet Affixus nail 130 degrees, 11 mm x 400 mm Affixus hip fracture nail lag screw 10.5 mm x 100 mm.   The patient was stabilized, brought to the recovery room, and then brought down to the orthopedic floor where he was treated by Physical Therapy and for pain control.   HISTORY: The patient is a 81 year old white male who was trying to get out of his bed last night and fell onto his left side. He was brought to the ED on the same day where x-rays confirmed a left intertroch fracture. He uses a cane for ambulation and is primarily a household ambulator with some community ambulation with assistance.   PHYSICAL EXAMINATION:  GENERAL: Well developed and thin.  HEENT: Pink conjunctiva right eye, difficulty keeping open at baseline. NECK: Supple. No masses. Trachea midline.  RESPIRATORY: Normal respiratory effort. Clear bowel sounds. No use of accessory muscles.  CARDIOVASCULAR: Heart regular rate and rhythm.  ABDOMEN: Denies tenderness. Hypoactive bowel sounds. GENITOURINARY: Foley catheter in place. EXTREMITIES: Negative cyanosis, clubbing. Left leg slightly externally rotated. In Buck's traction 5 pounds. DPN, SPN, tibial nerve intact to light touch. TA and gastrocs intact. Moves toes on command. No lesions on the hip. DP pulse +2.  SKIN: Normal to palpation, dry, compress turgor. PSYCHIATRIC: Poor insight and lethargic.   HOSPITAL COURSE: After admission on  07/02/2012, he had surgery the next day on 07/03/2012 without complication. Physical therapy was started on 07/04/2012 and he had slow progress with physical therapy. Initial hemoglobin on postoperative day 1 was 9.8 and hematocrit 26.8. His BUN and creatinine were elevated at 38 and 1.96. He had decreased urine output and IV fluids were increased. He had a temperature of 100.3. His O2 sats were 91% and his nasal cannula was increased to 3 liters from 2 liters. He has been oriented to self throughout his entire visit. On postoperative day 2, 07/05/2012, his BUN and creatinine continued to increase 43 and 2.0. His hemoglobin was stable. Oriented to self and daughter at bedside. Postoperative day 3, 07/06/2012, his creatinine stabilized and was on room air at that point with O2 sats 92%. He was progressing slow with physical therapy and was only able to get from the bed to the chair. On postoperative day 4, 07/07/2012, he was stable for transfer. Medicine has been following him during his visit and reported on 07/04/2012 that he would be stable for discharge on 07/07/2012.   CONDITION AT DISCHARGE: Stable.   DISPOSITION: The patient was sent to rehab.   DISCHARGE INSTRUCTIONS:  1. The patient will follow up with [facility] in two weeks for staple removal.  2. The patient will do physical therapy and be weight bearing as tolerated on the left lower extremity.  3. The patient will be patient on a regular diet.  4. TED hose knee-high bilaterally.  5. Dressing can be changed once daily and on an as-needed basis.   DISCHARGE MEDICATIONS:  1. Glipizide 5 mg 1 tab p.o. once daily.  2. Aspirin 81 mg once daily.  3. Metoprolol tartrate 25 mg tablet, one-half tab p.o. q.12 hours.  4. Allopurinol 100 mg one-half tab p.o. once daily.  5. Acetaminophen/hydrocodone 325/10 one tab p.o. q.6 hours p.r.n. pain.  6. Oxycodone 5 mg 1 tab p.o. q.3 to 4 hours p.r.n. pain.  7. Eszopiclone 2 mg tablet 1  tab p.o. at bedtime.  8. Ferrous sulfate 325 mg tablet 1 tab p.o. b.i.d.  9. Docusate sodium 100 mg capsule, 1 cap p.o. once daily.  10. Calcium/vitamin D 500 mg/200 International Units 1 tab p.o. b.i.d.  11. Enoxaparin 30 mg injectable subcu once daily.    ____________________________ Martyna Thorns M. Haskel KhanBerndt, NP amb:es D: 07/07/2012 08:22:16 ET T: 07/07/2012 09:24:35 ET JOB#: [id number]  cc: Mykle Pascua M. Haskel KhanBerndt, NP, <Dictator> Burt EkAPRIL M Kofi Murrell FNP ELECTRONICALLY SIGNED 08/01/2012 9:26

## 2014-11-05 NOTE — H&P (Signed)
PATIENT NAME:  Trevor [name], Trevor [name] MR#:  [id number] DATE OF BIRTH:  Jun 09, 1918  DATE OF ADMISSION:  01/27/2013  PRIMARY CARE PHYSICIAN:  Mickey Farber, MD  REQUESTING PHYSICIAN:  Sharyn Creamer, MD  CHIEF COMPLAINT: Fever, coughing and sick to stomach.  HISTORY OF PRESENT ILLNESS: The patient is 81 year old male who lives at home, has a known history of coronary artery disease, MI, CKD stage III, followed by hospice, is being admitted for septic shock likely due to pneumonia. The patient has been having cough with whitish-yellow sputum for about 2 weeks. Had 2 courses of antibiotics with some improvement. Also saw Dr. Meredeth Ide who felt that his chest was cleared up.  He started running fever up to 102 yesterday and took Tylenol, got really sick to her stomach and vomited this morning. He is followed by hospice at home. He was brought into the Emergency Department, as per recommendation from hospice people. While in the ED, he was hypotensive with a pressure of 72/40 and was found to have right lower lobe pneumonia. He is being admitted for further evaluation and management.   PAST MEDICAL HISTORY: 1.  Coronary artery disease and MI. 2.  Osteoarthritis. 3.  Anemia of chronic disease. 4.  CKD stage III with a baseline creatinine of 1.7 to 2.0. 5.  Hypertension.  6.  Diabetes.  7.  Difficulty hearing.  8.  CHF with EF of 25% to 35%.   ALLERGIES: No known drug allergies.   SOCIAL HISTORY: He lives at home. He has hospice following. No smoking, alcohol or drugs of abuse.   FAMILY HISTORY: Father died of intestinal rupture. Mother had some stomach issues, possibly cancer, although not confirmed.   PAST SURGICAL HISTORY: 1.  Appendectomy. 2.  Left prosthetic eye.  3.  Left hip nailing and pinning. 4.  Right hip hemiarthroplasty.   HOME MEDICATIONS:  1.  Tylenol 650 mg p.o. every 4 hours as needed.  2.  Allopurinol 100 mg 1/2 tablet p.o. daily. 3.  Aspirin 81 mg p.o. daily. 4.  Colace 100 mg p.o.  daily.  5.  Lasix 20 mg p.o. daily.  6.  Glipizide 5 mg p.o. daily. 7.  Lunesta 2 mg p.o. daily.  8.  Mirtazapine 15 mg 1/2 tablet p.o. at bedtime.  9.  Simethicone 80 mg p.o. every 8 hours needed.   REVIEW OF SYSTEMS: CONSTITUTIONAL: Positive for fever, fatigue, weakness. EYES:  No difficulty with vision. No pain or redness.  ENT: Difficulty hearing. RESPIRATORY: Positive for cough with whitish-yellow sputum. No wheezing or hemoptysis.  CARDIOVASCULAR: No chest pain, orthopnea, edema.  GASTROINTESTINAL: Positive for nausea and vomiting. No diarrhea. No abdominal pain.  GENITOURINARY: No dysuria or hematuria. Unable to urinate right in the ED.  ENDOCRINE: No polyuria or nocturia.  HEMATOLOGY: Anemia of chronic disease present.  MUSCULOSKELETAL: Positive for osteoarthritis.  NEUROLOGIC: No tingling, numbness. Positive for weakness.  PSYCHIATRIC: No history of anxiety or depression.   PHYSICAL EXAMINATION: VITAL SIGNS: Temperature 98, heart 109 per minute, respiration 18 per minute, blood pressure 72/40 mmHg, recent one was 85/50 mmHg, and he is saturating 93% on room air. GENERAL:  The patient is a 81 year old male lying in the bed comfortably without any acute distress.  EYES: Pupil equal, round and reactive to light and accommodation in the right eye. He is blind in the left eye. He has a prosthetic eye. No scleral icterus. Extraocular muscles intact.  HENT: Head atraumatic, normocephalic. Oropharynx and nasopharynx clear.  NECK: Supple. No jugular  venous distention. No thyroid enlargement or tenderness.  LUNGS: Clear to auscultation bilaterally. No wheezing, rales, rhonchi or crepitation.  HEART:  S1, S2 normal. No murmurs or gallop.  ABDOMEN: Soft, nontender, nondistended. Bowel sounds present. No organomegaly or masses.  EXTREMITIES:  No pedal edema, cyanosis or clubbing. NEUROLOGIC:  Nonfocal.  Cranial nerves II through XII intact. Motor and strength 5/5 in all extremities.  Sensation intact. PSYCHIATRIC:  The patient seems alert and oriented x 3, although he has difficulty hearing.  SKIN: No obvious rash, lesion or ulcer.   LABORATORY AND DIAGNOSTICS: Sodium 135, potassium 3.5, chloride 100, CO2 22, BUN 44, creatinine 2.27.  Blood sugar 283. BNP  6171. CK 44, MB fraction 1.1 and troponin 0.08. Normal CBC, except hemoglobin of 10.5, hematocrit 31.4 and platelets 158.  Lactic acid 4.3.   Chest x-ray in the ED showed right lung base pneumonia.   IMPRESSION AND PLAN: 1.  Septic shock, likely due to pneumonia, although cannot rule out urinary tract infection.  His urinalysis is still pending. Will start him on Levaquin at this time and will be monitored in the CCU stepdown considering his hypotension as he may require pressors. Family is in agreement. We will hold off on pressors at this time. If his mean arterial pressure drops less than 55, he may be started on pressors. 2.  Right lower lobe pneumonia. We will start him on Levaquin at this time. Monitor. Obtain 2 sets of blood cultures. 3.  Acute on chronic systolic heart failure with ejection fraction of 25% to 35% on last echocardiogram. We will hold off repeating at this time. Cannot use beta blocker, ACE inhibitor or Lasix due to hypotension. 4.  Acute on chronic kidney disease stage III.  Baseline creatinine of 1.7 to 2.0. This likely was possibly due to acute tubular necrosis, but cannot rule out dehydration.  5.  Hypokalemia. We will replete and recheck.  6.  Borderline elevated cardiac enzymes, likely due to supple/demand ischemia. We will do serial troponin and rule him out.  He is on aspirin. Cannot use any other beta blocker or blood pressure medicine due to hypotension.    CODE STATUS: DO NOT RESUSCITATE. He is already followed by hospice services and family does not want to be too aggressive, although they are okay with medications.   TOTAL TIME TAKING CARE OF THIS PATIENT: 55  minutes. ____________________________ Ellamae SiaVipul S. Sherryll BurgerShah, MD vss:sb D: 01/27/2013 16:14:41 ET T: 01/27/2013 16:32:46 ET JOB#: [id number]  cc: Tristin Vandeusen S. Sherryll BurgerShah, MD, <Dictator> Neomia Dearavid N. Harrington Challengerhies, MD Ellamae SiaVIPUL S [facility] MD ELECTRONICALLY SIGNED 02/02/2013 8:42

## 2014-11-05 NOTE — Discharge Summary (Signed)
PATIENT NAME:  Trevor [name], Trevor [name] MR#:  [id number] DATE OF BIRTH:  08/17/1917  ADDENDUM:  DATE OF ADMISSION:  07/22/2012 DATE OF DISCHARGE:  07/30/2012  The patient has done well over the past 24 hours with some lower abdominal pain, well controlled by pain medications. At the time of discharge, his Pincus Sanesnvanz is being stopped and the patient will be continued on Levaquin and Flagyl. The patient is tolerating his diet although does have poor appetite and will need further physical therapy and is being discharged to skilled nursing facility. Please refer to the discharge summary for other discharge medications and discharge instructions.   TIME SPENT: Time spent on the day of discharge and discharge activity was greater than 35 minutes.     ____________________________ Molinda BailiffSrikar R. Breshay Ilg, MD srs:ct D: 07/30/2012 11:16:54 ET T: 07/30/2012 11:42:01 ET JOB#: [id number]  cc: Wardell HeathSrikar R. Rosamaria Donn, MD, <Dictator> Orie FishermanSRIKAR R Madalee Altmann MD ELECTRONICALLY SIGNED 07/30/2012 23:46

## 2014-11-05 NOTE — Discharge Summary (Signed)
PATIENT NAME:  Trevor [name], MCKAMIE MR#:  [id number] DATE OF BIRTH:  October 18, 1917  DATE OF ADMISSION:  01/27/2013 DATE OF DISCHARGE:  01/30/2013  DISCHARGE DIAGNOSES:   1.  Septic shock with chest pain on admission, likely pneumonia, improving with antibiotics.   2.  Pneumonia, improving on antibiotics.   3.  Acute on chronic systolic heart failure, well-compensated at this time.  Ejection fraction of 25% to 35% on last echo.  We will hold off repeating at this time.  Unable to use beta-blocker, ACE inhibitor or Lasix due to hypotension.  4.  Acute on chronic kidney disease, stage 3, with a baseline creatinine of 1.7 to 2.0, improving on IV fluids.  5.  Borderline elevated cardiac enzymes, likely due to supply/demand ischemia.  It is trending down.  Also due to poor renal clearance from underlying chronic kidney disease.  This is not myocardial infarction.  6.  Fever/tachycardia, likely due to pneumonia, now improved.   SECONDARY DIAGNOSES: 1.  Coronary artery disease.  2.  Osteoarthritis. 3.  Anemia of chronic disease.  4.  Chronic kidney disease stage 3 with a baseline creatinine of 1.7 to 2.0.   5.  Hypertension.  6.  Diabetes.  7.  Difficulty hearing.  8.  Congestive heart failure with ejection fraction of 25% to 35%.   CONSULTATIONS:  None.   PROCEDURE AND RADIOLOGY:   1.  Chest x-ray on July 15 showed findings concerning for right lower lobe pneumonia.  2.  Repeat chest x-ray on 16th of July showed left lower lobe pneumonia.  Low-grade CHF with mild interstitial edema.   MAJOR LABORATORY PANEL:   1.  UA on admission was negative.  2.  Blood culture x 2 were negative.   HISTORY AND SHORT HOSPITAL COURSE:  The patient is a 81 year old male with above-mentioned medical problems who was admitted for septic shock thought to be secondary to pneumonia, was started on IV antibiotic.  Please see Dr. Margaretmary Eddy dictated history and physical for further details.  The patient was slowly improving with IV  hydration and IV antibiotics.  He remained afebrile since last 24 hours.  He is feeling much better and his blood pressure had improved and he is being discharged back to his home in fair condition.  He is already followed by hospice at home.   PHYSICAL EXAMINATION:  VITAL SIGNS:  On the date of discharge his vital signs are as follows:  Temperature 98, heart rate 99 per minute, respirations 20 per minute, blood pressure 113/67 mmHg.  He is saturating 94% on room air.  Pertinent physical examination on the date of discharge:  CARDIOVASCULAR:  S1, S2 normal.  No murmur, rubs or gallops.  LUNGS:  Clear to auscultation bilaterally.  No wheezing, rales, rhonchi or crepitation.  ABDOMEN:  Soft, benign.  NEUROLOGIC:  Nonfocal examination.  All of the other physical examination remained at baseline.   DISCHARGE MEDICATIONS: 1.  Allopurinol 100 mg half tablet by mouth daily.  2.  Colace 100 mg by mouth daily.  3.  Glipizide 5 mg by mouth daily.  4.  Lunesta 2 mg by mouth at bedtime.  5.  Tylenol 650 mg by mouth every four hours as needed.  6.  Lasix 20 mg by mouth daily.  7.  Simethicone 80 mg by mouth every eight hours as needed.  8.  Mirtazapine 15 mg half tablet by mouth at bedtime.  9.  Levaquin 750 mg by mouth every other day for three more  doses.   DISCHARGE DIET:  Low sodium.   DISCHARGE ACTIVITY:  As tolerated.   DISCHARGE INSTRUCTIONS AND FOLLOW-UP:  The patient was instructed to follow up with his primary care physician, Dr. Mickey Farberavid Thies, in 1 to 2 weeks.  He will be followed by hospice at home.   Total time discharging this patient is 55 minutes.    ____________________________ Ellamae SiaVipul S. Sherryll BurgerShah, MD vss:ea D: 01/30/2013 17:16:00 ET T: 01/31/2013 00:52:57 ET JOB#: [id number]  cc: Rossana Molchan S. Sherryll BurgerShah, MD, <Dictator> Neomia Dearavid N. Harrington Challengerhies, MD Outside Physician Ellamae SiaVIPUL S [facility] MD ELECTRONICALLY SIGNED 02/02/2013 8:42

## 2014-11-05 NOTE — Discharge Summary (Signed)
PATIENT NAME:  Trevor [name], Trevor [name] MR#:  [id number] DATE OF BIRTH:  17-Feb-1918  DATE OF ADMISSION:  07/22/2012 DATE OF DISCHARGE:  07/30/2012  ADMITTING PHYSICIAN:  Sital P. Juliene Pina, MD  DISCHARGING PHYSICIAN:  Enid Baas, MD  PRIMARY CARE PHYSICIAN:  Einar Crow, MD, but now being followed by Dr. Maryellen Pile at [facility].   CONSULTATIONS IN THE HOSPITAL:  Surgical consultation by Dr. Claude Manges.   DISCHARGE DIAGNOSES:   1.  Acute sigmoid colitis on admission.  2.  Persistent right upper quadrant pain of unknown etiology.  3.  Chronic systolic congestive heart failure with ejection fraction of 35%.  4.  Acute renal failure on presentation.  5.  Chronic kidney disease, stage 3 to 4. Baseline creatinine around 1.4 to 1.7 range.  6.  Chronic anemia/anemia of chronic disease.  7.  Recent left hip surgery, recovering at [facility].  8.  Hypertension.  9.  Diabetes mellitus.  10.  Right pleural effusion from congestive heart failure with basilar infiltrate.  11.  Gout.  12.  Sick sinus syndrome, status post pacemaker.   DISCHARGE HOME MEDICATIONS:   1.  Allopurinol 50 mg p.o. daily.  2.  Aspirin 81 mg p.o. daily.  3.  Colace 100 mg p.o. daily.  4.  Ferrous sulfate 325 mg p.o. b.i.d.  5.  Glipizide 5 mg p.o. daily.  6.  Lunesta 2 mg p.o. at bedtime for sleep.  7.  Metoprolol tartrate 12.5 mg p.o. b.i.d.  8.  Calcium with vitamin D 500 mg/200 International Units 1 tablet p.o. b.i.d.  9.  Vitamin D3 at 2000 International Units p.o. daily.  10.  Norco 5/325 mg 1 tablet q. 6 hours p.r.n. for pain.  11.  Tylenol 650 mg q. 4 hours p.r.n. for pain or fever.  12.  Lisinopril 5 mg p.o. daily.  13.  Lasix 20 mg p.o. daily.  14.  Lactulose 30 mL p.o. daily p.r.n. for constipation.  15.  Simethicone 80 mg p.o. q. 8 hours p.r.n. for bloating and gas.  16.  Protonix 40 mg p.o. daily.  17.  Levaquin 500 mg p.o. daily for 7 more days.   DISCHARGE DIET:  Soft diet, low-sodium diet.    DISCHARGE ACTIVITY:  As tolerated.    FOLLOWUP INSTRUCTIONS:   1.  PCP followup in 1 week.  2.  Physical therapy.   DIAGNOSTIC DATA PRIOR TO DISCHARGE:  1.  Sodium 148, potassium 3.0, chloride 114, bicarbonate 25, BUN 30, creatinine 1.35, glucose 173 and calcium 8.2.  2.  ALT 15, AST 33, alkaline phosphatase 126, total bilirubin 0.8, albumin of 1.8.  3.  Troponins remained negative while in the hospital.  4.  Chest x-ray: Left-sided pacemaker, bilateral left lung atelectasis versus minimal infiltrate, blunting of left costophrenic angle seen.  5.  CT of the head without contrast showing no intracranial process and generalized cerebral atrophy.  6.  CT of the abdomen and pelvis from 07/22/2012 showing multiple air fluid levels with colon and mid-colonic wall thickening involving ascending colon, pericolonic inflammatory changes concerning for colitis, also mottled stool and pneumatosis involving proximal half of the ascending colon cannot be entirely excluded. Inflammatory changes also in the sigmoid colon and rectal region. Bibasilar airspace disease concerning for atelectasis and pneumonia is present.  7.  Repeat CT of the abdomen and pelvis on 07/25/2012 showing thickening of sigmoid colon wall and portion of cecum. He is more constipated than on previous study. No evidence of intra-abdominal abscess or bowel  obstruction or ileus seen concerning for colitis at this time, small bilateral pleural effusion, right lower lobe atelectasis or developing pneumonia is present. No evidence of hepatobiliary abnormality seen; otherwise, no acute urinary tract abnormality present.  8.  Ultrasound of the abdomen for right upper quadrant is obscured by presence of bowel gas and gallbladder could not be visualized. Observed portion of the liver is within normal limits.  9.  HIDA scan done showing normal hepatobiliary study.  10.  A CT of the chest without contrast is showing bibasilar atelectasis pneumonia  with bilateral pleural effusions and component of congestive heart failure.   BRIEF HOSPITAL COURSE:  The patient is a 81 year old Caucasian male with past medical history significant for congestive heart failure with EF of 35%, hypertension, diabetes, dementia, and CKD, stage III, with baseline creatinine around 1.5, who was recently in the hospital in December 2013 for a left hip fracture; [facility]. He was brought back on 07/22/2012 for altered mental status, acute abdominal pain and constipation.  1.  Metabolic encephalopathy with acute abdominal pain secondary to colitis. The patient has been having trouble with constipation for a few days at the rehab and also taking pain medications p.r.n. for physical therapy with his hip pain. He presented with acute abdominal pain and the CT with the p.o. contrast only done showed diffuse colitis involving ascending colon and sigmoid colon and in the rectal regions, too. Because of his severe peritoneal signs and tenderness on exam, surgical consult was also placed. He was empirically placed on IV antibiotics with Invanz and was n.p.o. After extensive discussion with the family and the surgeon felt like he was not a great surgical candidate and the plan was to manage conservatively. There was no acute indication for surgery anyway based on the CT at this time. The patient had shown significant improvement over the hospital course with much improvement in his tenderness, except he still has some right upper quadrant tenderness. He has his gallbladder in, but the HIDA scan is negative and no evidence of acute cholecystitis seen. I am not sure if it is referred pain from his colitis of ascending colon and a repeat CAT scan showed the same changes without any worsening or abscess formation or obstruction or rupture. At this point, pain management is recommended the best for the patient, and he is able to tolerate diet well. So, he is being slowly  advanced from clear liquids to soft diet prior to discharge. He will have close outpatient followup as well and advised to continue to take pain medications as needed for his pain as it is not limiting his movement. Also, he was placed on an extensive laxative regimen for his constipation and had several bowel movements prior to discharge. His antibiotics are being changed to Levaquin prior to discharge at this time to complete a 10-day course.   2.  Chronic systolic CHF with bilateral pleural effusions and atelectasis. The patient was not on Lasix or ACE inhibitor when he initially came in; probably that was stopped during his past hospitalization due to dehydration and acute renal failure. Now, he did receive some fluids this hospitalization, so low-dose Lasix is being added at this time and he was also started on low-dose ACE inhibitor as his renal function is stable and he is already on metoprolol. He can follow up for this as an outpatient.  3.  Acute renal failure on CKD, baseline creatinine around the 1.42 range. Acute renal failure improved.  It was probably ATN from acute illness. Low-dose Lasix and ACE inhibitor are being added at the time of discharge and follow up as an outpatient.  4.  Chronic anemia/anemia of chronic disease, probably from his renal disease. There was no acute indication for transfusion during this admission. Hemoglobin has been stable all along.  5.  Elevated troponin during this admission was probably from his CKD and poor renal clearance and not acute MI.  6.  Diabetes mellitus. His glipizide was held while he was on clear liquid diet in the hospital and is being restarted at the time of discharge as his diet is being advanced.  7.  His course has been otherwise uneventful in the hospital.   DISCHARGE CONDITION:  Guarded.   DISCHARGE DISPOSITION:  To [facility].   ADDITIONAL TIME SPENT:  35 minutes.    ____________________________ Enid Baas,  MD rk:si D: 07/29/2012 15:23:47 ET T: 07/29/2012 21:19:00 ET JOB#: [id number]  cc: Enid Baas, MD, <Dictator> Serita Sheller. Maryellen Pile, MD  Enid Baas MD ELECTRONICALLY SIGNED 08/06/2012 19:31

## 2014-11-06 NOTE — Discharge Summary (Signed)
PATIENT NAME:  Trevor [name], [name] MR#:  [id number] DATE OF BIRTH:  11/08/1917  DATE OF ADMISSION:  06/04/2014 DATE OF DISCHARGE:  06/07/2014  PRIMARY CARE PHYSICIAN:  Neomia Dearavid N. Harrington Challengerhies, MD  CHIEF COMPLAINT AT THE TIME OF ADMISSION:  Chest pain.   ADMITTING DIAGNOSES: 1.  Left-sided chest pain, essential pneumonia versus acute coronary syndrome, with left pleural effusion.  2.  Left-sided pleural effusion, which could be infectious versus congestive heart failure.  3.  Elevated troponin.  4.  Acute-on-chronic renal failure.  5.  Chronic anemia due to diabetes mellitus.  6.  Chronic systolic heart failure with ejection fraction of 25 to 35%.   DISCHARGE DIAGNOSES: 1.  Left-sided chest pain, probably pleuritic in nature, with an underlying pneumonia.  2.  Left-sided pleural effusion, probably parapneumonic.  3.  Elevated troponin. No uptrend, probably from demand ischemia.  4.  Acute-on-chronic renal failure. At the time of discharge, creatinine was 2, which seems to be his baseline.  5.  Chronic anemia. Hemoglobin is stable.  6.  Diabetes mellitus, which is chronic.  7.  Chronic systolic congestive heart failure.   CONSULTATIONS:  Interventional radiology.   PROCEDURES:  Left-sided ultrasound-guided thoracentesis with 800 mL of bloody fluid extraction done on November 23.   BRIEF HISTORY AND PHYSICAL:  The patient is a 81 year old pleasant male with history of chronic systolic heart failure, who came in to the ED with a chief complaint of chest pain. Please review history and physical for details. The patient had developed a fever of 101 degrees Fahrenheit, chills, and sweats at the time of admission.   HOSPITAL COURSE:  Left-sided chest pain seemed to be pleuritic from underlying pneumonia. The patient was continued on IV antibiotic levofloxacin. With IV fluids, the patient improved. Serial images were done to see if there was an improvement in the pleural effusion with IV antibiotics. As there  was no significant improvement, interventional radiology was consulted. The patient had left-sided ultrasound-guided thoracentesis done on November 23 and had 800 mL of fluid extraction. The patient tolerated the procedure well, and the plan was to continue levofloxacin.   1.  Left-sided pleural effusion was assumed to be parapneumonic from underlying pneumonia. The patient had ultrasound-guided thoracentesis on the left side on November 23 and had 800 mL of fluid extraction.  2.  Bacteremia with positive blood cultures for Escherichia coli. The patient was pansensitive. Plan was to continue levofloxacin.  3.  Elevated troponin at the time of admission has not uptrended. It was assumed to be from demand ischemia.  4.  Acute-on-chronic renal failure. The patient was given IV fluids, and creatinine was monitored. The patient's creatinine had gone up from 1.9 to 2.5 over the past year. On November 23, his creatinine was at 2.0, which seemed to be his baseline. The plan is to avoid nephrotoxins in the future.  5.  Chronic systolic heart failure with ejection fraction of 25 to 30%. He was not fluid-overloaded during the hospital course, so his home medications were continued.  Otherwise, hospital course was uneventful. The patient has very strong family support and lives with his daughters. Two daughters take care of him at home, and they both wanted to take him back home. PT has evaluated the patient, and they have recommended outpatient physical therapy. As the patient's clinical situation is significantly improved, decision is made to discharge the patient home. On November 22, the patient's creatinine was at 2.08. Sodium and potassium were normal. GFR was 32. Serum  osmolality was normal. Troponin was 0.12 and 0.11. WBC was 9.1, hemoglobin 8.2, hematocrit 24.7, and platelet count was 127,000. Primary care physician is to follow up on the pleural fluid pathology. Pleural fluid initial clarity was hazy yellow in  color with LDH of 71, protein 2.8, and glucose of 128. Chest x-ray on November 23 revealed stable left-sided pleural effusion from probably underlying pneumonia or atelectasis. Repeat chest x-ray after left-sided thoracentesis showed no evidence of pneumothorax and decreased left-sided pleural effusion with some persistent atelectasis versus infiltrate in the left lower lobe.   MEDICATIONS AT THE TIME OF DISCHARGE:  Ciprofloxacin 500 mg p.o. once daily for 5 days, Tylenol 325 mg 2 tablets p.o. every 6 hours as needed for pain, aspirin 81 mg recommended to start on November 25, vitamin D 50,000 international units 1 capsule p.o. every 2 weeks, glipizide 5 mg 1 tablet p.o. once daily, furosemide 20 mg 1 to 2 tablets p.o. once a day, mirtazapine 50 mg half tablet p.o. at bedtime, Lunesta 10 mg 1 tablet p.o. once daily at bedtime for sleep, allopurinol 100 mg half tablet p.o. once a day for gout.   DIET:  Low fat, low sodium, diabetic.   ACTIVITY:  As tolerated. For physical therapy, the patient was recommended to follow up with outpatient physical therapy.   FOLLOWUP:  Appointment with primary care physician in 1 week, nephrology in 2 weeks, cardiology in 2 weeks, and outpatient CHF clinic in a week.   Plan of care was discussed with the patient and his 2 daughters at bedside. They verbalized understanding of the plan.   TOTAL TIME SPENT:  45 minutes.     ____________________________ Ramonita Lab, MD ag:nb D: 06/10/2014 17:28:11 ET T: 06/10/2014 22:15:52 ET JOB#: [id number]  cc: Ramonita Lab, MD, <Dictator> Ramonita Lab MD ELECTRONICALLY SIGNED 06/11/2014 18:14

## 2014-11-06 NOTE — H&P (Signed)
PATIENT NAME:  Trevor [name], Trevor [name] MR#:  [id number] DATE OF BIRTH:  1918-01-17  DATE OF ADMISSION:  06/04/2014  REFERRING EMERGENCY ROOM PHYSICIAN: Dr. Mayford Knife.  PRIMARY CARE PHYSICIAN: Dr. Audelia Acton.  CHIEF COMPLAINT: Chest pain.   HISTORY OF PRESENT ILLNESS: This is a very pleasant 81 year old man with past medical history of chronic systolic heart failure, ejection fraction 25-35%, coronary artery disease, status post bypass surgery, diabetes, who presents today with pain in the left chest. He has been feeling fairly weak for the past few days with decreased p.o. intake. For the past week, he has also had a cough not productive of any sputum and shortness of breath with exertion. This afternoon, he began to complain of pain in the left chest which radiated to the back. Later, he developed a fever of 101 degrees, chills, and sweats. At that time, his daughters decided to bring him to the Emergency Room for further evaluation. Hospitalist service is asked to admit for further evaluation and treatment.   PAST MEDICAL HISTORY:  1.  Chronic systolic heart failure with an ejection fraction of 25-35%.  2.  Coronary artery disease, status post bypass grafting.  3.  Diabetes mellitus, type 2.  4.  Chronic kidney disease, stage III.  5.  Hypertension.  6.  Anemia of chronic disease.   PAST SURGICAL HISTORY:  1.  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  2.  Appendectomy.  3.  Removal of right fifth finger and fifth metacarpal bone.  4.  Left hip nail and pinning.  5.  Right hip replacement with subsequent revision.   SOCIAL HISTORY: The patient lives with his daughter Dennie Bible. His daughter Darel Hong visits daily. He uses a wheelchair or walker for ambulation. He does not smoke cigarettes or drink alcohol or use illicit substances.   FAMILY HISTORY: His mother died of possible stomach cancer. His father died of an intestinal rupture.   ALLERGIES: No known allergies.   HOME MEDICATIONS:  1.  Vitamin D2  at 50,000 international units 1 capsule every 2 weeks.  2.  Mirtazapine 15 mg 0.5 tablet once a day at bedtime.  3.  Lunesta 2 mg 1 tablet once a day at bedtime.  4.  Glipizide 5 mg extended release tablet 1 tablet once a day.  5.  Furosemide 20 mg 1 to 2 tablets once a day.  6.  Allopurinol 100 mg 0.5 tablet orally once a day for gout.   REVIEW OF SYSTEMS:  CONSTITUTIONAL: Positive for fevers, chills, fatigue, weakness, and left-sided chest pain. No weight change.  HEENT: No pain in the eyes or ears. No change in vision or hearing. He has a prosthetic left eye. He wears a hearing aid on the right and is deaf in the left ear. No sore throat or difficulty swallowing.  RESPIRATORY: No hemoptysis. He has had a cough. No sputum production. Some shortness of breath with exertion. No wheezing. No history of COPD or tuberculosis.  CARDIOVASCULAR: He does have left-sided chest pain, which radiates to the back. No edema, palpitations, syncope.  GASTROINTESTINAL: No nausea, vomiting, or diarrhea. He has had decreased p.o. intake. No hematochezia or melena.  GENITOURINARY: No dysuria or frequency.  MUSCULOSKELETAL: Positive for generalized weakness. No new joint pain. He does have a history of gout and osteoarthritis.  NEUROLOGIC: No confusion, seizure, history of stroke, or migraine. No focal numbness or weakness.  PSYCHIATRIC: No uncontrolled anxiety or depression.   PHYSICAL EXAMINATION:  VITAL SIGNS: Temperature 98.7, pulse 81, respirations  18, blood pressure 95/42, oxygenation 97% on room air.  GENERAL: No acute distress, resting comfortably in the exam bed.  EYES: Left eye is prosthetic. Right pupil is reactive to light. Conjunctiva is clear. Extraocular motion is intact. Mucous membranes are dry. Posterior oropharynx is clear with no exudate, edema, or erythema. He has upper and lower dentures.  NECK: Supple, trachea midline, no cervical lymphadenopathy. Thyroid is nontender.  PULMONARY: He has  decreased breath sounds on the left base with some crackles above at the mid left lung field. Right lung is clear. Good air movement.  CARDIOVASCULAR: Regular rate and rhythm. He does have a 3/6 systolic ejection murmur. Peripheral pulses are 2+. There is trace edema bilaterally.  ABDOMEN: Soft, nontender, nondistended. Bowel sounds are normal.  MUSCULOSKELETAL: No joint effusions. Strength is 5/5 throughout. Range of motion is normal.  NEUROLOGIC: Cranial nerves II through XII grossly intact with the exception of prosthetic left eye. Strength and sensation intact. Nonfocal neurologic examination.  PSYCHIATRIC: The patient is alert and oriented x 4 with good insight into his clinical condition. He is very hard of hearing and difficult to communicate with, but there seemed to be no signs of uncontrolled depression or anxiety.   LABORATORY DATA: Sodium 140, potassium 3.9, chloride 105, bicarbonate 23, BUN 41, creatinine 2.53, BNP 11,162, glucose 228. Troponin is 0.11. White blood cells 13.8, hemoglobin 9.8, platelets 170,000, MCV is 96. Lactic acid 2.8.   IMAGING: Chest x-ray shows a moderate sized left pleural effusion with some passive atelectasis. Difficult to excuse possibility of lower lobe pneumonia superimposed on this process. Prior CABG and aortic valve replacement. Pacer is in place. Diffuse bony demineralization.   ASSESSMENT AND PLAN:  1.  Left-sided chest pain: Differential includes pneumonia, cardiac process, acute coronary syndrome, left pleural effusion. We will cycle cardiac enzymes and admit to telemetry for possibility of acute coronary syndrome. He has had fever, leukocytosis suggestive of pneumonia. We will initiate broad-spectrum coverage with Levaquin. Obtain blood cultures. I have ordered a thoracentesis for the morning to evaluate pleural effusion.  2.  Left-sided pleural effusion: This was new from prior chest x-ray 1 year ago. Differential includes infection versus congestive  heart failure. I have ordered a thoracentesis for the morning to evaluate further. I have discussed this with the patient and the family and they are in favor of this procedure.  3.  Elevated troponins: Continue to cycle and observe on telemetry. I have initiated aspirin and statin therapy. We will hold off on full anticoagulation at this time unless troponins continue to trend upward. He is currently chest pain free.   4.  Acute on chronic renal failure: Creatinine has gone from 1.9 to 2.5 over the past year. We will provide gentle hydration. Continue to monitor. If not improved in the morning, may need further evaluation. Hold nephrotoxic agents at this time.  5.  Chronic anemia: Hemoglobin is stable from prior examination 1 year ago.  6.  Diabetes mellitus: Check a hemoglobin A1c. He will be on sliding scale insulin while inpatient.  7.  Chronic systolic heart failure with ejection fraction of 25-35%: He has received a substantial amount of fluid in the Emergency Room. We will decrease intravenous fluids to 50 mL and continue to monitor. We will recheck BNP in the morning.  8.  Prophylaxis: Heparin for deep venous thrombosis prophylaxis. We will hold off on gastrointestinal prophylaxis.   TIME SPENT ON ADMISSION: 50 minutes.    ____________________________ Ena Dawley. Clent Ridges, MD cpw:ts  D: 06/04/2014 21:35:48 ET T: 06/04/2014 21:59:53 ET JOB#: [id number]  cc: Santina Evansatherine P. Clent RidgesWalsh, MD, <Dictator> Gale JourneyATHERINE P WALSH MD ELECTRONICALLY SIGNED 06/19/2014 11:48

## 2014-11-07 ENCOUNTER — Inpatient Hospital Stay
Admit: 2014-11-07 | Disposition: A | Discharge status: Skilled Nursing Facility | Payer: Self-pay | Attending: Internal Medicine | Admitting: Internal Medicine

## 2014-11-07 LAB — BASIC METABOLIC PANEL
Anion Gap: 10 (ref 7–16)
BUN: 32 mg/dL — ABNORMAL HIGH
CALCIUM: 8.1 mg/dL — AB
Chloride: 101 mmol/L
Co2: 29 mmol/L
Creatinine: 2.08 mg/dL — ABNORMAL HIGH
EGFR (African American): 31 — ABNORMAL LOW
GFR CALC NON AF AMER: 26 — AB
GLUCOSE: 200 mg/dL — AB
Potassium: 3.6 mmol/L
SODIUM: 140 mmol/L

## 2014-11-07 LAB — PROTIME-INR
INR: 1.5
PROTHROMBIN TIME: 18.7 s — AB

## 2014-11-07 LAB — CBC
HCT: 27.5 % — ABNORMAL LOW (ref 40.0–52.0)
HGB: 7.9 g/dL — ABNORMAL LOW (ref 13.0–18.0)
MCH: 22.4 pg — ABNORMAL LOW (ref 26.0–34.0)
MCHC: 28.8 g/dL — AB (ref 32.0–36.0)
MCV: 78 fL — ABNORMAL LOW (ref 80–100)
PLATELETS: 267 10*3/uL (ref 150–440)
RBC: 3.53 10*6/uL — ABNORMAL LOW (ref 4.40–5.90)
RDW: 18.8 % — AB (ref 11.5–14.5)
WBC: 6.8 10*3/uL (ref 3.8–10.6)

## 2014-11-07 LAB — TROPONIN I
TROPONIN-I: 0.06 ng/mL — AB
Troponin-I: 0.06 ng/mL — ABNORMAL HIGH

## 2014-11-07 LAB — PRO B NATRIURETIC PEPTIDE: B-Type Natriuretic Peptide: 513 pg/mL — ABNORMAL HIGH

## 2014-11-07 NOTE — Consult Note (Signed)
Brief Consult Note: Diagnosis: jaundice, ac on chronic renla failure, hypotension, n/v, fever, ? pancreatitis, diabetes, HL, h/o HTN,.   Patient was seen by consultant.   Consult note dictated.   Recommend further assessment or treatment.   Orders entered.   Discussed with Attending MD.   Comments: 1. Jaundice, likley obstructive, despite CBD being 5 mm on US, following labs, get dicect bilirubin, blood cx, start imipenem for ?cholanghitis , presenting itself with fever, jaundice, RUQ pain 2. Ac on chr renal failure, IVF, Foley, ua, r/o UTI, gertting cx as we\ll, gettiong US of kidneys and nephrology ion volved 3. hypotension, IVF, careful not to overload, has cardiomyopatyhy with EF 35%, holding metoprolol 4. N/v, IVF, ice chips if able 5. ? pancreatitis, gallsone, repeat lipase 6. Hyperlipidemia, holding statin 7. DM, SSI 8 h/o HTN, now hypotensive, holding metoprolol Thanks for consult, we'll follow.  Electronic Signatures: Katharina CaperVaickute, Angellynn Kimberlin (MD)  (Signed 16-Apr-13 19:38)  Authored: Brief Consult Note   Last Updated: 16-Apr-13 19:38 by Katharina CaperVaickute, Jette Lewan (MD)

## 2014-11-07 NOTE — Consult Note (Signed)
Brief Consult Note: Diagnosis: jaundice, biliary pancreatitis.   Patient was seen by consultant.   Consult note dictated.   Recommend further assessment or treatment.   Comments: Please see full GI consult [phone]#[id number].  Patietn presenting with acute illness of n/v/abd pain (epigastric and ruq) some loose stools.  Patietn With lab evidence of biliary pancreatitis and elevated bili but normal ast/alt today.  Of note patient had elevated transaminases when seen by his PMD on 4/10, bili and AP not checked that date.  Patient with elevated wbc today compared to that date.      Patient is feeling better currently compared to yesterday continues with some epigastric pain.  Concern for cholecystitis, in a more comlicated setting of recent use of drugs that alter lfts (allopurinol and HMGCoA reductase).  Will await labs from tomorrow am, recommending MRCP vs CT with pancreatic protocol vs hepatobiliary scan depending on those results.  Continue abx as you are.  Appreciate IM assistance. Thank you for this consult.  Electronic Signatures: Barnetta ChapelSkulskie, Keaton Stirewalt (MD)  (Signed 16-Apr-13 20:55)  Authored: Brief Consult Note   Last Updated: 16-Apr-13 20:55 by Barnetta ChapelSkulskie, Ceasia Elwell (MD)

## 2014-11-07 NOTE — Consult Note (Signed)
PATIENT NAME:  Trevor [name], Trevor [name] MR#:  [id number] DATE OF BIRTH:  [birth year]  DATE OF CONSULTATION:  10/31/2011  REFERRING PHYSICIAN:  Theodoro Grist, MD  CONSULTING PHYSICIAN:  Muhammed Teutsch Lilian Kapur, MD  REASON FOR CONSULTATION: Severe acute renal failure.   HISTORY OF PRESENT ILLNESS: The patient is a 81 year old Caucasian male with past medical history of coronary artery disease status post coronary artery bypass graft in November of 2012, history of aortic stenosis status post aortic valve replacement, history of complete heart block status post pacemaker placement, chronic systolic heart failure, hyperlipidemia, chronic kidney disease stage III with baseline creatinine 1.74, COPD, diabetes mellitus, hypertension, anemia of chronic kidney disease, L3 to L4 vertebral compression fracture, and history of severe right arm injury who presented to [facility] with anorexia, abdominal pain, and jaundice. The patient is very hard of hearing and, therefore, most of the history was offered by the patient's daughter. She reports to me that the patient began developing nausea and vomiting as well as fever on Wednesday of this week. He then felt better on Thursday but later on Friday began developing nausea and abdominal pain again. Subsequent to this he developed some jaundice. Thereafter, he stopped eating and has had very limited p.o. intake since Saturday. The patient was found to have pancreatitis with a lipase of 1377. In addition, he had an elevated alkaline phosphatase of 292 with a direct bilirubin of 5.5 and total bilirubin of 6.8. Abdominal ultrasound showed possible acute cholecystitis. Gastroenterology is currently following. We are now consulted for evaluation and management of acute renal failure. The patient's baseline creatinine as before was 1.7. Upon presentation the patient's BUN was 105 and creatinine was 4.59. Creatinine has risen to 4.68 today. He has only made 500 mL of urine  output since admission. In regards to his urinalysis, a small amount of protein was noted at 30 mg/dL, 14 RBCs per high-power field were noted and 35 WBCs per high-power field were noted. The patient also appears to have gram-negative bacteremia.   PAST MEDICAL HISTORY:  1. Coronary artery disease status post coronary artery bypass graft in November 2012.  2. Aortic valve stenosis with aortic valve replacement at the time of CABG with paramount magna bioprosthetic valve.  3. History of complete heart block status post pacemaker placement in November 2012.  4. Chronic systolic heart failure.  5. Hyperlipidemia.  6. Chronic kidney disease stage III, baseline creatinine 1.7.  7. Chronic obstructive pulmonary disease.  8. Diabetes mellitus.  9. Hypertension.  10. Anemia of chronic kidney disease.  11. History of L3 to L4 vertebral compression fracture.   PAST SURGICAL HISTORY:  1. Coronary artery bypass graft.  2. Aortic valve replacement that is bioprosthetic.  3. History of appendectomy.  4. History of severe right upper extremity injury with loss of two fingers and status post right arm reconstructive surgery.   ALLERGIES: No known drug allergies.   CURRENT INPATIENT MEDICATIONS:  1. Normal saline at 125 mL/h. 2. Allopurinol 50 mg p.o. daily.  3. Colace 100 mg p.o. daily.  4. Lunesta 2 mg p.o. at bedtime.  5. Sliding scale insulin.  6. Metoprolol 12.5 mg p.o. q.12 hours, which is currently on hold.  7. Zofran 4 mg IV q.4 hours p.r.n. nausea.  8. Protonix 40 mg IV q.12 hours. 9. Tramadol 50 mg p.o. q.6 hours. 10. Meropenem 500 mg IV q.8 hours.   SOCIAL HISTORY: The patient's daughter lives with him. No reported tobacco, alcohol, or illicit  drug use.   FAMILY HISTORY: Significant for coronary artery disease, hypertension, diabetes mellitus.   REVIEW OF SYSTEMS: Unable to obtain from the patient as he is very hard of hearing and unable to understand many of the questions that I  pose.   PHYSICAL EXAMINATION:   VITAL SIGNS: Temperature 98.1, pulse 82, respirations 18, blood pressure 95/54, pulse oximetry 92%.   GENERAL: 28 Caucasian male who appears younger than his stated age currently in no acute distress.   HEENT: Normocephalic, atraumatic. Extraocular movements are intact. The patient has a left eye prosthesis. Right pupil is reactive. There is jaundiced sclerae noted. No epistaxis. Oral mucosa very dry.   NECK: Supple. No JVD or lymphadenopathy.   LUNGS: Clear to auscultation bilaterally with normal respiratory effort.   CARDIOVASCULAR: S1, S2, currently has regular rate and rhythm. No audible murmurs or rubs.   ABDOMEN: Soft. There is mild midepigastric tenderness noted. No rebound, no guarding at present.   EXTREMITIES: No clubbing, cyanosis, or edema.   NEUROLOGIC: The patient is awake and alert and extremely hard of hearing and unable to understand the questions that I pose. When speaking very loudly, the patient was able to understand a few questions and he did follow simple commands with his upper extremities.   SKIN: Warm and dry. Skin grafting of the right upper extremity noted.   MUSCULOSKELETAL: There is a deformity of the right upper extremity, otherwise, no joint redness, swelling, or tenderness appreciated.   PSYCHIATRIC: Difficult to assess at this time.   GU: No suprapubic tenderness noted at this time.   LABORATORY DATA: Sodium 130, potassium 3.8, chloride 94, CO2 22, BUN 106, creatinine 4.68, glucose 210. LFTs showed total protein 5.5, albumin 1.9, total bilirubin 6.8, direct bilirubin 5.5, alkaline phosphatase 292, AST 40, ALT 26. CBC shows WBC 10.8, hemoglobin 9.9, hematocrit 29.7, platelets 74,000. Blood culture shows gram-negative rods in the aerobic bottle. Urinalysis shows urine protein 30 mg/dL, 14 RBCs per high-power field, 35 WBCs per high-power field.  2-D echocardiogram shows ejection fraction 25 to 35% from  05/25/2011.  Renal ultrasound shows right kidney 9.79 cm, left kidney 11.1 cm. No hydronephrosis noted.   Abdominal ultrasound shows cholelithiasis with gallbladder wall thickening and pericholecystic fluid concerning for acute cholecystitis.   IMPRESSION: This is a 81 year old Caucasian male with past medical history of coronary artery disease status post coronary artery bypass graft in November 2012, aortic stenosis status post aortic valve replacement with magna bioprosthetic valve, complete heart block status post pacemaker placement, chronic systolic heart failure, ejection fraction 25 to 35%, hyperlipidemia, chronic kidney disease stage III, COPD, non-insulin-dependent diabetes mellitus, hypertension, and anemia of chronic kidney disease who presented to [facility] with nausea, severe dehydration, anorexia, pancreatitis, cholelithiasis, and bacteremia.   PROBLEM LIST:  1. Acute renal failure.  2. Chronic kidney disease, stage III.  3. Anemia of chronic kidney disease.  4. Pancreatitis.  5. Cholelithiasis.  6. Sepsis with gram-negative rods in the blood.   PLAN: The patient presents with very severe acute renal failure at this point in time. His BUN is above 100. I suspect that he has acute tubular necrosis at this time as he has had prolonged dehydration which likely led to acute tubular necrosis. In addition, he has evidence for sepsis with gram-negative rods in the blood as well as relative hypotension. He has not made much urine over the past 24 hours. Therefore, we elect to proceed with hemodialysis after discussing risks, benefits, and alternatives  with the patient's daughter who identifies herself as the health care power-of-attorney. We will consult Vascular Surgery for temporary dialysis catheter placement. I have already discussed the case with Dr. Delana Meyer who will be by later to place a temporary dialysis catheter. We will then proceed with dialysis. Dialysis  flow rate will be 300, blood flow rate 150, no ultrafiltration to be performed. We will likely opt for dialysis tomorrow as well. We will also obtain extensive serologic work-up including SPEP, UPEP, ANA, ANCA antibodies, and GBM antibodies. We will also obtain urine protein to creatinine ratio. Agree with antibiotic selection at this point in time and would have pharmacy follow for dosing of meropenem. In addition in regards to his anemia of chronic kidney disease, will hold off on administering Epogen at this point in time. We will check SPEP and UPEP to further evaluate his anemia as well. The patient is also being evaluated by Gastroenterology as well as Surgery and currently ERCP versus MRCP is being considered.   I would like to thank Dr. Ether Griffins for this kind interesting consultation. We will continue to monitor the patient's progress with you.   ____________________________ Tama High, MD mnl:drc D: 10/31/2011 12:07:40 ET T: 10/31/2011 12:22:38 ET JOB#: [id number]  cc: Tama High, MD, <Dictator> Mariah Milling Blaire Hodsdon MD ELECTRONICALLY SIGNED 11/19/2011 21:14

## 2014-11-07 NOTE — Consult Note (Signed)
PATIENT NAME:  Trevor [name], Trevor [name] MR#:  [id number] DATE OF BIRTH:  23-Oct-1917  DATE OF CONSULTATION:  10/30/2011  REFERRING PHYSICIAN:  Renda RollsWilton Smith, MD  CONSULTING PHYSICIAN:  Christena DeemMartin U. Maryuri Warnke, MD  REASON FOR CONSULTATION: Abnormal liver enzymes.   HISTORY OF PRESENT ILLNESS: Trevor [name] is [name] 81 year old Caucasian male. History is quite difficult to obtain as he is extremely hard of hearing. He has only one of his daughters with him, unfortunately not the daughter that does most of the care for him. Apparently this past Wednesday he began to have an episode of feeling sick. He had nausea with emesis as well as some fever. There was some epigastric to right upper quadrant area pain at that time but it was relatively more mild. He also had [name] bit of loose stools at that time. This then worsened and on Friday the abdominal pain increased quite [name] bit and he became more nauseated with fever. He has been having some dark urine for about [name] week. Family and the patient do not know if he was having dark urine prior to the episode of the nausea or vomiting. He has been having [name] clay-colored stool for the past day or two. Because of this new illness, he was taken to his primary care physician earlier this morning. Labs there indicated that he had abnormal liver testing with an increased total bilirubin. Subsequently, he underwent an ultrasound of the gallbladder that showed [name] sludge-like mass in the gallbladder as well as some gallstones. The gallbladder wall was felt to be thickened with pericholecystic fluid. There was no evidence of intra or extrahepatic ductal dilation. The pancreas was not well imaged. Currently he is actually feeling better than this morning and yesterday. He has had no emesis this afternoon. He is currently afebrile.   PAST MEDICAL HISTORY:  1. History of coronary artery disease having had coronary artery bypass grafting x3 in November of 2012.  2. History of aortic stenosis with aortic valve  replacement done with [name] bioprosthetic valve.  3. History of pacemaker implanted as well in November of 2012, this being [name] permanent pacer, dual-chamber type.  4. History of chronic obstructive pulmonary disease with [name] reduced FEV1 of 67%.. 5. History of chronic kidney disease, type III. 6. Chronic congestive heart failure, cardiomyopathy, with ejection fraction of only 35%.. 7. History of non-insulin-dependent diabetes mellitus. 8. Hypertension. 9. Hyperlipidemia. 10. Anemia of chronic disease. 11. Appendectomy.  12. History of L3-L4 compression fracture.  13. He had an injury in 1947 which required him to have reconstructive surgery on his right arm.  14. 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  15. He has had back injuries x2.   OUTPATIENT MEDICATIONS:  1. Glipizide 5 mg. 2. Aspirin 81 mg daily. 3. Vitamin D2 50,000 units one every other week.  4. Simvastatin 20 mg every night. This recently was stopped. 5. Lunesta 2 mg at bedtime.  6. Tramadol 50 mg every six hours p.r.n.  7. Stool softener.  8. Colchicine 0.6 mg one twice [name] day. 9. Metoprolol tartrate 25 mg one-half tablet every 12 hours. 10. Lasix 40 mg once [name] day. 11. Potassium chloride 20 mEq once [name] day. 12. Allopurinol 100 mg one-half tablet once [name] day. He has been on that medication since this past December, however, that was recently stopped with this episode of jaundice and illness.   LABORATORIES THAT WERE OBTAINED [facility]ARLI[facility]NO[facility]: Glucose 208, sodium 126, potassium 4.1,  chloride 90, bicarb 24.0, BUN 90, creatinine 4.6, calcium 8.1, AST 33, ALT 31, alkaline phosphatase 316, albumin 2.7, total protein 5.3, total bilirubin 9.1. This has not been fractionated. Lipase was elevated at 333 on [name] scale of 11 to 82. Serum amylase was normal at 89 on [name] scale of 28 to 103. He did have [name] hemogram done showing [name] white count of 10.2, hemoglobin and hematocrit 11.2/31.5, MCV 90.5, platelet count 70.    Review of previous laboratories done through his outpatient physician, these being done on 10/24/2011, showed his AST elevated at 188, note now normal; ALT elevated at 99, again note now normal. His platelet count was 107 on that date and his WBC was 4.7 with [name] 12.6 hemoglobin. His creatinine on that date was also 2.3, this more consistent with his probable baseline creatinine and CKD III.   Again, the ultrasound he had done earlier today showed cholelithiasis with gallbladder wall thickening and pericholecystic fluid concerning for acute cholecystitis. Surgery consultation was recommended.  Further laboratory evaluation at [facility] show his lipase at 1377, his hemogram showing [name] white count of 9.6, hemoglobin and hematocrit 10.1/29.4, platelet count 66, MCV 93.   ASSESSMENT: Biliary pancreatitis presenting concurrently with obstructive jaundice. It is quite likely that the patient passed some of the sludge-type material into the common bile duct obstructing both the bile duct as well as the pancreatic duct resulting in the current picture. There is no evidence of ductal dilatation on the abdominal ultrasound that was done today, however, the patient is feeling much better today than he did over the past several days. He still shows an elevated lipase as well as an elevated total bilirubin. Direct bilirubin is not yet available. He presents being dehydrated with an elevation of his creatinine in the setting of [name] history of chronic kidney disease likely due to his recurrent emesis over the past week. His jaundice could be also multifactorial in that he was previously on allopurinol as well as the evidence of gallbladder sludge and gallstone as well as being on [name] statin drug. Both the statin and the allopurinol have now been held.   The patient currently states he is feeling better than he did when he saw his primary physician earlier today and indeed much better than yesterday. I am concerned that he does  have cholecystitis and that there was blockage of the bile duct for some period of time as indicated above causing both [name] biliary pancreatitis as well as an obstructive jaundice picture.   RECOMMENDATIONS:  1. Agree with antibiotic coverage as you are. This should help with any ascending cholangitis picture.  2. Repeat laboratories in the morning to include full set of LFTs with fractionated bilirubin, lipase, and metabolic panel as well as CBC.  3. There are several options in regards to further evaluation of the jaundice and pancreatitis picture. Currently the patient is feeling better than he did and I would await laboratories from tomorrow unless he has further symptoms indicating [name] deterioration of his condition. Possible testing would include MRCP as well as pancreatic protocol CT, MRCP being quite useful for determining if he does indeed have stones in the common bile duct which may be "ball valving". Some concern for possible neoplastic lesion as well which could be complicating this picture.  4. Another consideration would be hepatobiliary scanning as this picture may be complicated indeed by cholecystitis as well.  ____________________________ Christena Deem, MD mus:drc D: 10/30/2011 20:47:09 ET T: 10/31/2011 06:04:04 ET  JOB#: [id number] Christena Deem MD ELECTRONICALLY SIGNED 11/06/2011 22:30

## 2014-11-07 NOTE — Op Note (Signed)
PATIENT NAME:  Trevor [name], Stevin A MR#:  [id number] DATE OF BIRTH:  10-05-1917  DATE OF PROCEDURE:  10/31/2011  PREOPERATIVE DIAGNOSES:  1. Acute renal failure.  2. Sepsis.   POSTOPERATIVE DIAGNOSES:    1. Acute renal failure.  2. Sepsis.   PROCEDURE PERFORMED: Insertion of a right femoral non-tunneled temporary dialysis catheter.   SURGEON: Renford DillsGregory G. Holten Spano, M.D.   DESCRIPTION OF PROCEDURE: The patient is in the Intensive Care Unit. He is intubated, sedated. The risks and benefits of insertion of a temporary dialysis catheter for initiation of dilated dialysis were discussed with the family. They wish for us to proceed.   The right groin is prepped and draped in a sterile fashion. Ultrasound is placed in a sterile sleeve. Ultrasound is utilized secondary to lack of appropriate landmarks to avoid vascular injury. Image is recorded for the permanent record. Femoral vein is noted to be echolucent, homogeneous and compressible. Under continuous visualization, a Seldinger needle is inserted into the femoral vein, J-wire followed by counterincision. Dilator is passed over the wire and the dialysis temporary dialysis catheter is then fed over the wire without difficulty. Both lumens aspirate and flush easily. The catheter is secured to the skin of the thigh with 2-0 nylon and a sterile dressing is applied. The patient tolerated the procedure well and there were no immediate complications.   ____________________________ Renford DillsGregory G. Arti Trang, MD ggs:ap D: 11/23/2011 10:15:35 ET T: 11/23/2011 12:03:38 ET JOB#: [id number] cc: Renford DillsGregory G. Yailen Zemaitis, MD, <Dictator> Renford DillsGREGORY G Aslynn Brunetti MD ELECTRONICALLY SIGNED 12/03/2011 17:40

## 2014-11-07 NOTE — Consult Note (Signed)
Chief Complaint:   Subjective/Chief Complaint eating regular diet.  denies nausea or abdominal pain. several bm yesterday after oral contrast for ct.   VITAL SIGNS/ANCILLARY NOTES: **Vital Signs.:   23-Apr-13 14:03   Vital Signs Type Routine   Temperature Temperature (F) 97.4   Celsius 36.3   Temperature Source oral   Pulse Pulse 84   Pulse source per Dinamap   Respirations Respirations 18   Systolic BP Systolic BP 163   Diastolic BP (mmHg) Diastolic BP (mmHg) 74   Mean BP 103   BP Source Dinamap   Pulse Ox % Pulse Ox % 96   Pulse Ox Activity Level  With exertion   Oxygen Delivery Room Air/ 21 %   Brief Assessment:   Cardiac Irregular    Respiratory clear BS    Gastrointestinal details normal Soft  Nontender  Nondistended  No masses palpable  Bowel sounds normal   Routine Hem:  23-Apr-13 04:07    WBC (CBC) 5.3   RBC (CBC) 2.68   Hemoglobin (CBC) 8.4   Hematocrit (CBC) 25.6   Platelet Count (CBC) 195   MCV 95   MCH 31.3   MCHC 32.9   RDW 14.7   Neutrophil % 67.4   Lymphocyte % 18.7   Monocyte % 8.9   Eosinophil % 3.9   Basophil % 1.1   Neutrophil # 3.6   Lymphocyte # 1.0   Monocyte # 0.5   Eosinophil # 0.2   Basophil # 0.1  Blood Glucose:  23-Apr-13 16:40    POCT Blood Glucose 248   Assessment/Plan:  Assessment/Plan:   Assessment 1) biliary pancreatitis-currently asymptonatic 2) cri/arf-stable, possibly now close to baseline.    Plan 1) currently doing well. agree with advancing diet, outpatient fu. will sign off, reconsult as needed.   Electronic Signatures: Barnetta ChapelSkulskie, Johnthan Axtman (MD)  (Signed 23-Apr-13 22:59)  Authored: Chief Complaint, VITAL SIGNS/ANCILLARY NOTES, Brief Assessment, Lab Results, Assessment/Plan   Last Updated: 23-Apr-13 22:59 by Barnetta ChapelSkulskie, Zhoe Catania (MD)

## 2014-11-07 NOTE — Consult Note (Signed)
Chief Complaint:   Subjective/Chief Complaint denies abdominal pain or nausea, now on clears.  s/p hd times2 tolerated well.  unable to do mrcp due to pacemaker.   VITAL SIGNS/ANCILLARY NOTES: **Vital Signs.:   18-Apr-13 10:14   Vital Signs Type Q 4hr   Temperature Temperature (F) 98   Celsius 36.6   Temperature Source oral   Pulse Pulse 80   Pulse source per Dinamap   Respirations Respirations 18   Systolic BP Systolic BP 97   Diastolic BP (mmHg) Diastolic BP (mmHg) 57   Mean BP 70   BP Source Dinamap   Pulse Ox % Pulse Ox % 96   Pulse Ox Activity Level  At rest   Oxygen Delivery Room Air/ 21 %    14:08   Vital Signs Type Routine   Temperature Temperature (F) 97.5   Celsius 36.3   Temperature Source oral   Pulse Pulse 80   Pulse source per Dinamap   Respirations Respirations 18   Systolic BP Systolic BP 114   Diastolic BP (mmHg) Diastolic BP (mmHg) 65   Mean BP 81   BP Source Dinamap   Pulse Ox % Pulse Ox % 96   Pulse Ox Activity Level  At rest   Oxygen Delivery Room Air/ 21 %   Brief Assessment:   Cardiac Irregular    Respiratory clear BS    Gastrointestinal details normal Soft  Nontender  Nondistended  No masses palpable  Bowel sounds normal   Routine Chem:  18-Apr-13 04:11    Glucose, Serum 82   BUN 82   Creatinine (comp) 3.60   Sodium, Serum 135   Chloride, Serum 100   CO2, Serum 23   Calcium (Total), Serum 7.6  Hepatic:  18-Apr-13 04:11    Bilirubin, Total 4.9   Alkaline Phosphatase 260   SGPT (ALT) 19   Total Protein, Serum 4.9   Albumin, Serum 1.6  Routine Chem:  18-Apr-13 04:11    Osmolality (calc) 294   eGFR (African American) 16   eGFR (Non-African American) 14   Anion Gap 12   Lipase 1427   Potassium, Serum 3.6  Hepatic:  18-Apr-13 04:11    SGOT (AST) 32  Blood Glucose:  18-Apr-13 07:55    POCT Blood Glucose 96   Assessment/Plan:  Assessment/Plan:   Assessment 1) abnormal lft's-improving, asymptomatic currently.  Probably  related to stones/sludge/meds multifactorial. 2) biliary pancreatitis-increasing lipase likely from poor renal elimination of circulating lipase.  However, also concern for possible malignancy.  ultrasound done not adequate for pancreas evaluation.  unable to do MRCP due to pacemaker    Plan 1) ct abd and pelvis with oral contrast as possible.  following.   Electronic Signatures: ,  (MD)  (Signed 18-Apr-13 18:23)  Authored: Chief Complaint, VITAL SIGNS/ANCILLARY NOTES, Brief Assessment, Lab Results, Assessment/Plan   Last Updated: 18-Apr-13 18:23 by ,  (MD) 

## 2014-11-07 NOTE — Consult Note (Signed)
PATIENT NAME:  Trevor [name], Trevor [name] MR#:  [id number] DATE OF BIRTH:  03-20-1918  DATE OF CONSULTATION:  10/30/2011  REFERRING PHYSICIAN:  Renda RollsWilton Smith, MD CONSULTING PHYSICIAN:  Trevor Caperima Malissie Musgrave, MD  REASON FOR CONSULTATION: Medical management of [name] patient who is coming to the hospital.   HISTORY OF PRESENT ILLNESS: The patient is [name] 81 year old Caucasian male with past medical history significant for history of multiple medical problems including hyperlipidemia, hypertension, diabetes mellitus, anemia, and coronary artery disease who presented to the hospital with complaints of not feeling well. According to the patient's daughter, as the patient is having significant difficulty giving me history, he has been having problems with increasing fatigue, difficulty swallowing, decrease in activity tolerance, as well as some lower extremity swelling, shortness of breath, and very poor appetite. Apparently he was doing well up until 10/24/2011 when he had an episode of nausea and vomiting, as well as possibly diarrhea. He also had fevers above 101 and presented to the hospital for further evaluation. At that time, he had lab studies done which showed mildly elevated creatinine of 2.3 with BUN of 52. However, apparently since 10/24/2011, the patient has been declining. He is not able to eat. Each time he tries to eat he would become nauseated but would not vomit. He has been dizzy, faint, and has significant muscle weakness. He is not able to walk and was seen by Dr. Renda RollsWilton [name] today who admitted him. He was seen by Dr. Harrington Challengerhies today, on 10/30/2011. Lab studies were performed and the patient was noted to be in acute renal failure with creatinine of 4.6 and total bilirubin of 9.1, and hospitalist services were contacted for consultation.  The patient himself is denying any abdominal pain at this moment.  PAST MEDICAL HISTORY:  1. Hypertension. 2. Hyperlipidemia.  3. Diabetes mellitus, type 2, on p.o. medications.   4. Anemia. 5. Coronary artery disease status post coronary artery bypass grafting x3 in November 2012 with left internal mammary to LAD, reverse saphenous vein graft to OM1, and reverse saphenous vein graft to RCA.  6. History of aortic stenosis, status post aortic valve replacement, with 23 mm Perimount Magna prosthetic valve.  7. Complete heart block status post placement of permanent pacemaker in November 2012 with Medtronic Adapta [id number] dual chamber pacemaker. 8. History of chronic obstructive pulmonary disease with FEV1 of 67% of predicted.  9. Chronic kidney disease stage III. 10. Chronic congestive heart failure. 11. Cardiomyopathy with ejection fraction of 35%. 12. Diabetes mellitus.  13. Systemic arterial hypertension. 14. Hyperlipidemia.  15. Anemia of chronic disease. 16. History of L3 to L4 vertebral compression fractures.  PAST SURGICAL HISTORY:  1. Status post coronary artery bypass grafting.  2. Appendectomy.  3. Status post right arm reconstructive surgery in the 1940s.   MEDICATIONS: According to Dr. Lady [name] the patient is on the following. 1. Glipizide 5 mg, extended-release, p.o. daily. 2. Aspirin 81 mg p.o. daily. 3. Vitamin D2 50,000  unit capsule weekly.  4. Simvastatin 20 mg p.o. daily.  5. Lunesta 2 mg p.o. at bedtime. 6. Tramadol 50 mg p.o. every six hours as needed.  7. Docusate 100 mg p.o. daily.  8. Colchicine 0.6 mg p.o. twice daily as needed.  9. Metoprolol tartrate 25 mg tablet 1/2 tablet twice daily.  10. Lasix 40 mg p.o. daily.  11. Potassium 20 mg p.o. daily.  12. Allopurinol 100 mg 1/2 tablet once daily.   ALLERGIES: No known drug allergies.   SOCIAL HISTORY: He retired in  1983. No tobacco or alcohol abuse.   FAMILY HISTORY: Positive for early coronary artery disease, hypertension, and diabetes, according to medical records.   REVIEW OF SYSTEMS: Review of systems is difficult to obtain as the patient has significant difficulties hearing. He  denies any significant pain at this time. He admits of fatigue and weakness, as above. The patient admits of fatigue and fever [name] week ago, some chest pain earlier, and some difficulty swallowing intermittently, decreased activity tolerance, lower extremity swelling, shortness of breath, decreased appetite, nausea and vomiting as well as episodes of diarrhea since [name] week ago, joint stiffness, muscle weakness, dizziness, feeling faint as well as feeling depressed. EYES: Otherwise, he denies any trouble with vision. ENT: Denies any tinnitus, allergies, epistaxis, sinus pain, dentures, or difficulty swallowing. RESPIRATORY: Denies any cough, wheezes, asthma, or COPD. CARDIOVASCULAR: Denies chest pain, orthopnea, edema, arrhythmias, palpitations, or syncope. GASTROINTESTINAL: Denies any abdominal pain at this time, however, apparently he was complaining of some abdominal pain earlier, in the right side of his abdomen, radiating to his right flank. GENITOURINARY: No dysuria, hematuria, frequency, or incontinence. ENDOCRINOLOGY: Denies any polydipsia, nocturia, thyroid problems, heat or cold intolerance, or increased thirst. HEMATOLOGIC: Denies anemia, easy bruising, bleeding, or swollen glands. SKIN: Denies any acne, rashes, lesions, or change in moles. MUSCULOSKELETAL: Denies arthritis, cramps, or swelling. NEUROLOGIC: Denies numbness, epilepsy, or tremor. PSYCH: Denies anxiety, insomnia, or depression.   PHYSICAL EXAMINATION:   VITAL SIGNS: On arrival to the hospital, the patient's temperature was 97.9, pulse 80, respiration rate 18, blood pressure 91/52, and saturation 97% on room air.   GENERAL: This is [name] well developed, well nourished, pale and jaundiced Caucasian male, very weak and lying on the stretcher.   HEENT: Pupils are equally round and reactive to light. Extraocular movements are intact. No icterus or conjunctivitis. He has significant difficulty hearing. No pharyngeal erythema. Mucosa is dry.    NECK: Neck did not reveal any masses. It was supple and nontender. Thyroid was not enlarged. No adenopathy, no JVD or carotid bruits bilaterally. Full range of motion.   LUNGS: Clear to auscultation in all fields. No diminished breath sounds or wheezing. No labored inspirations, increased effort, dullness to percussion, or overt respiratory distress.   CARDIOVASCULAR: S1 and S2 appreciated. No murmurs, gallops, or rubs were noted. PMI was not lateralized. Chest is nontender to palpation.   EXTREMITIES: Diminished pedal pulses. 1 to 2 lower extremity edema bilaterally. No calf tenderness or cyanosis was noted.   ABDOMEN: Soft, somewhat tender in the epigastric area as well as left lower quadrant, but no rebound or guarding was noted. No hepatosplenomegaly or masses were noted.   RECTAL: Deferred.   MUSCULOSKELETAL: Able to move all extremities, however, the patient does have significant stiffness in his upper extremity movements and not able to sit up by himself in the bed.   NEUROLOGIC: Cranial nerves grossly intact. Sensory grossly intact. No significant dysarthria or aphasia.   PSYCHIATRIC: The patient is somewhat somnolent, poorly cooperative. Memory is impaired. No significant confusion or agitation is noted.  LABS/STUDIES: Labs are pending in the hospital. Apparently labs were done on 10/24/2011. At that time, the patient's sodium was 138, potassium 4.6, bicarbonate 32.4, BUN and creatinine were 52 and 2.3, and glucose level 127. CBC showed white blood cell count of 4.7, hemoglobin 12.6, and platelet count 107.   Repeated labs done today, on 10/30/2011, showed BUN and creatinine of 19 and 4.6, sodium 126, potassium 4.1, chloride 90, bicarbonate 24, calcium  8.1, alkaline phosphatase 316, albumin 2.7, total protein 5.3, total bilirubin 9.1, lipase 333, and amylase 89.   EKG showed wide QRS rhythm, approximately 70 to 80 beats per minute, and nonspecific ST-T changes were noted.    RADIOLOGIC STUDIES: Ultrasound of the abdomen, general survey, on 10/30/2011, revealed cholelithiasis with gallbladder wall thickening and pericholecystic fluid concerning for acute cholecystitis. Surgical consultation was recommended.   ASSESSMENT AND PLAN:  1. Jaundice: Very likely jaundice is related to bile duct obstruction despite the patient's ultrasound being unrevealing and common bile duct measuring only 5 mm. We will repeat the patient's lab studies now, LFTs. We will be holding the patient's medications such as Zocor and we will follow the patient's liver enzymes. Surgery is already involved and they will be making decisions in regards to surgery if needed. We will also initiate antibiotic therapy after blood cultures are taken with Rocephin.  2. Acute chronic renal failure: Start the patient on IV fluids, get urinalysis to rule out urinary tract infection, and continue antibiotic therapy for now.  3. Hypertension: We will be holding the patient's blood pressure medications. We will continue IV fluids at high rate.  4. History of hyperlipidemia: Continue diet, but hold the patient's Zocor.  5. Intermittent nausea and vomiting: We will give the patient [name] clear liquid diet and observe his appetite. We will also get [name] two-way of abdomen series. Possibly nausea and vomiting is related to acute cholecystitis, however, we cannot rule out any other condition.  6. History of diabetes mellitus: Continue the patient on sliding scale insulin due to his unpredictable p.o. intake.  7. History of anemia: Follow the patient's labs.  8. Questionable acute pancreatitis, possibly gallstone related: We will initiate clear liquid diet if the patient is able to take it. We will try ice chips initially. We will continue PPIs at this time as well as antibiotic therapy.  TIME SPENT: One hour.  ____________________________ Trevor Caper, MD rv:slb D: 10/30/2011 19:27:06 ET T: 10/31/2011 10:04:57  ET JOB#: [id number]  cc: Trevor Caper, MD, <Dictator> Neomia Dear. Trevor Challenger, MD Trevor Caper MD ELECTRONICALLY SIGNED 11/06/2011 20:07

## 2014-11-07 NOTE — H&P (Signed)
PATIENT NAME:  Trevor [name], Trevor [name] MR#:  [id number] DATE OF BIRTH:  06/05/1918  DATE OF ADMISSION:  10/30/2011  CHIEF COMPLAINT: This 81 year old male, accompanied by two of his daughters, was referred by Dr. Harrington Challenger, comes in emergently with chief complaint of right flank pain.   HISTORY OF PRESENT ILLNESS: He was in his usual state of health until some seven days ago when he developed some discomfort in the right side of his abdomen and torso and also in his right hip region. He developed a fever of 101, vomited, but did not throw up any blood. He later vomited again two days later, and since then he has been anorexic with limited oral intake and has had decreased urine output. As of today, the daughters realized his urine was dark in color. He went in to see Dr. Harrington Challenger this morning and had some lab work which included a finding of a total bilirubin of 9.1. He also had a low platelet count of 70,000. He had an ultrasound of the gallbladder which did demonstrate gallstones and also a mobile echogenic mass-like area within the gallbladder likely representing a sludge ball. The gallbladder wall was thickened at 9.4 mm with pericholecystic fluid present. There was no intra or extrahepatic biliary ductal dilatation. The common bile duct was a maximum of 5 mm. The pancreas was not well seen. The spleen was unremarkable.   PAST MEDICAL HISTORY: The past medical history was reviewed, and he does have a history of:  1. Coronary artery disease and had coronary artery bypass surgery in November 2012. He also has a history of aortic stenosis and had valve replacement with a 23 mm [employer] Magna bioprosthetic valve. He reports he has done very well since then. He has had no chest pain. 2. He does have a history of complete heart block and insertion of pacemaker in November 2012.  3. He also does have a history of cardiomyopathy.  4. History of chronic congestive heart failure.  5. Hyperlipidemia.  6. Chronic kidney  disease.  7. Chronic obstructive pulmonary disease with history of FEV1 of 67% predicted.  8. Non-insulin-dependent diabetes mellitus.  9. Hypertension. 10. Anemia of chronic disease.  11. Hypertension. 12. History of L3-L4 vertebral compression fracture.  PAST SURGICAL HISTORY: Surgical history includes: 1. The above-mentioned coronary artery bypass graft and aortic valve replacement.  2. Distant history of appendectomy.  3. 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 He had to have right arm reconstructive surgery, and tissue was donated from the right upper abdomen to the right arm.  4. History of back fracture x2.   MEDICATIONS:  1. Glipizide 5 mg daily.  2. Aspirin 81 mg daily. 3. Vitamin B2 50,000 units every other week. 4. Simvastatin 20 mg, recently discontinued.  5. Lunesta 2 mg at bedtime p.r.n. for sleep. 6. Tramadol 50 mg every 6 hours p.r.n. for pain.  7. Docusate sodium 100 mg daily.  8. Colchicine 0.6 mg b.i.d. p.r.n. for joint pain.  9. Metoprolol 12.5 mg every 12 hours. 10. Lasix 40 mg daily. 11. Potassium chloride supplement 20 mEq daily. 12. Allopurinol 50 mg daily.  13. Recently he had Cipro prescribed 500 mg daily for 10 days, but he has not yet filled the prescription.   DRUG ALLERGIES: No known drug allergies.   SOCIAL HISTORY: He is accompanied by two of his daughters. He does not smoke. He does not drink any alcohol.   FAMILY  HISTORY: Positive for diabetes, heart disease, hypertension, lung disease, stomach cancer.   REVIEW OF SYSTEMS: He arrived here in a wheelchair. He has quite a bit of difficulty walking and requires assistance. He is generally fatigued, has had a recent fever as noted above. 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 He does have some difficulty swallowing, especially large pills. He has not had any recent chest pain. He does tend to get some bilateral ankle  edema for which he takes Lasix. He does get dyspneic easily with exertion. He has become anorexic and had the nausea and vomiting as noted last week. He reports no constipation or diarrhea, but stool has been slightly lighter in color than normal. He has joint stiffness and pain, particularly in his back, muscle weakness. He feels lightheaded and has some history of depression. does not particularly bleed easily. Review of systems is otherwise negative.   PHYSICAL EXAMINATION:  GENERAL: He was initially seen in a wheelchair, but then with quite a bit of assistance he maneuvered up onto the examination table. He was awake and alert and very hard of hearing. He can hear some out of his right ear.   VITAL SIGNS: His weight is 165, blood pressure 186/48, temperature is 98.0, pulse is 81.   SKIN: Skin appears jaundiced, particularly around his face.   HEENT: I noted the artificial eye on the left. Pupil was reactive to light on the right. He has dentures. Pharynx is clear.   NECK: No palpable mass. No jugular venous distention.   LUNGS: Lungs sounds were clear.   HEART: Regular rhythm, S1 and S2 with audible murmur, approximately grade 2.   CHEST: He has a palpable pacemaker.   ABDOMEN: Soft and flat, nontender, with no palpable mass. No hepatomegaly.   EXTREMITIES: Trace bilateral ankle edema. He does have absence of two fingers and some significant deformity of his right hand, also long scars of the forearm. No acute swelling or edema of the right hand or arm.   LABORATORY, DIAGNOSTIC AND RADIOLOGICAL DATA:  As noted above, his total bilirubin this morning was 9.1 Direct bilirubin has been requested. Albumin was low at 2.7.  His glucose is 208, sodium 126, potassium 4.1, creatinine is 4.6, BUN is 90.  His CBC on the 10th was with a hemoglobin of 12.6  His creatinine on the 10th was 2.3.  He had an EKG on January 8th with paced rhythm.    IMPRESSION:  1. Jaundice. 2. Gallstones.   3. Failure to thrive. 4. Dehydration.   RECOMMENDATIONS: I recommended admission to the hospital and placed him on IV fluids. I have discussed with Dr. Marva PandaSkulskie, Gastroenterology consultation. I think also it would be prudent to obtain Internal Medicine consultation. I have discussed with him that I do not think he clearly needs to have gallbladder surgery at present as he may have hepatocellular disease. I discussed with Dr. Marva PandaSkulskie the  possibility of him having hepatitis, so we will get him admitted.    ____________________________ Shela CommonsJ. Renda RollsWilton Smith, MD jws:cbb D: 10/30/2011 18:20:53 ET T: 10/30/2011 18:42:09 ET JOB#: [id number]  cc: Adella HareJ. Wilton Smith, MD, <Dictator> Neomia Dearavid N. Harrington Challengerhies, MD Adella HareWILTON J SMITH MD ELECTRONICALLY SIGNED 10/31/2011 7:42

## 2014-11-07 NOTE — Consult Note (Signed)
Chief Complaint:   Subjective/Chief Complaint "not feeling good", denies nausea or abdominal pain. No bm today, some confusion with foley.  On clear liquid diet.   VITAL SIGNS/ANCILLARY NOTES: **Vital Signs.:   19-Apr-13 13:51   Vital Signs Type Routine   Temperature Temperature (F) 97.6   Celsius 36.4   Temperature Source oral   Pulse Pulse 80   Pulse source per Dinamap   Respirations Respirations 18   Systolic BP Systolic BP 446   Diastolic BP (mmHg) Diastolic BP (mmHg) 68   Mean BP 83   BP Source Dinamap   Pulse Ox % Pulse Ox % 94   Pulse Ox Activity Level  At rest   Oxygen Delivery Room Air/ 21 %   Brief Assessment:   Cardiac Irregular    Respiratory clear BS    Gastrointestinal details normal Soft  Nontender  Nondistended  No masses palpable  Bowel sounds normal   Routine Chem:  19-Apr-13 04:33    Glucose, Serum 120   BUN 46   Creatinine (comp) 2.43   Sodium, Serum 138   Chloride, Serum 102   CO2, Serum 25   Calcium (Total), Serum 7.4  Hepatic:  19-Apr-13 04:33    Bilirubin, Total 3.9   Alkaline Phosphatase 238   SGPT (ALT) 14   Total Protein, Serum 4.9   Albumin, Serum 1.5  Routine Chem:  19-Apr-13 04:33    Osmolality (calc) 289   eGFR (African American) 26   eGFR (Non-African American) 22   Anion Gap 11  Routine Hem:  19-Apr-13 04:33    WBC (CBC) 8.7   RBC (CBC) 2.76   Hemoglobin (CBC) 8.6   Hematocrit (CBC) 25.8   Platelet Count (CBC) 96   MCV 94   MCH 31.0   MCHC 33.1   RDW 14.3   Neutrophil % 76.8   Lymphocyte % 9.9   Monocyte % 12.1   Eosinophil % 1.0   Basophil % 0.2   Neutrophil # 6.7   Lymphocyte # 0.9   Monocyte # 1.0   Eosinophil # 0.1   Basophil # 0.0  Routine Chem:  19-Apr-13 04:33    Lipase 974   Potassium, Serum 3.5  Hepatic:  19-Apr-13 04:33    SGOT (AST) 31  Misc Urine Chem:  19-Apr-13 05:41    Creatinine, Urine 168.7   Protein, Random Urine 44   Protein/Creat Ratio (comp) 261  Routine Chem:  19-Apr-13 09:00     Phosphorus, Serum 3.0  Blood Glucose:  19-Apr-13 12:33    POCT Blood Glucose 96    17:03    POCT Blood Glucose 132   Assessment/Plan:  Assessment/Plan:   Assessment 1) abnormal lfts in the setting of cholelithiasis/gallbladder sludge-on abx 2) biliary pancreatitis-no sx currently, lipase coming down, likely some false elevation due to renal insufficiency.    Plan 1) recommend ct abd and pelvis with at least oral contrast.  creatinine continues to be elevated, but improving. recheck labs in am 2) will start miralax  Dr Vira Agar rounding over the weekend.   Electronic Signatures: Loistine Simas (MD)  (Signed 19-Apr-13 18:45)  Authored: Chief Complaint, VITAL SIGNS/ANCILLARY NOTES, Brief Assessment, Lab Results, Assessment/Plan   Last Updated: 19-Apr-13 18:45 by Loistine Simas (MD)

## 2014-11-07 NOTE — Consult Note (Signed)
Chief Complaint:   Subjective/Chief Complaint denies abd pain or nausea.  some confusion.   VITAL SIGNS/ANCILLARY NOTES: **Vital Signs.:   22-Apr-13 14:10   Vital Signs Type Routine   Temperature Temperature (F) 97.9   Celsius 36.6   Temperature Source oral   Pulse Pulse 83   Pulse source per Dinamap   Respirations Respirations 20   Systolic BP Systolic BP 539   Diastolic BP (mmHg) Diastolic BP (mmHg) 67   Mean BP 93   Pulse Ox % Pulse Ox % 96   Pulse Ox Activity Level  At rest   Oxygen Delivery Room Air/ 21 %   Brief Assessment:   Cardiac Irregular    Respiratory clear BS    Gastrointestinal details normal Soft  Nontender  Nondistended  No masses palpable  Bowel sounds normal   Routine Chem:  22-Apr-13 04:08    Glucose, Serum 177   BUN 33   Creatinine (comp) 2.35   Sodium, Serum 140   Chloride, Serum 106   CO2, Serum 26   Calcium (Total), Serum 7.7  Hepatic:  22-Apr-13 04:08    Bilirubin, Total 2.2   Alkaline Phosphatase 229   SGPT (ALT) 11   Total Protein, Serum 5.4   Albumin, Serum 1.6  Routine Chem:  22-Apr-13 04:08    Osmolality (calc) 291   eGFR (African American) 27   eGFR (Non-African American) 23   Anion Gap 8   Lipase 953   Potassium, Serum 4.7  Hepatic:  22-Apr-13 04:08    SGOT (AST) 33   Bilirubin, Direct 1.6  Routine Chem:  22-Apr-13 04:08    Phosphorus, Serum 3.3   Radiology Results: CT:    22-Apr-13 15:25, CT Abdomen and Pelvis Without Contrast   CT Abdomen and Pelvis Without Contrast    REASON FOR EXAM:    (1) abnorrmal lfts, biliary pancreatitis; (2)   abnormal lfts, biliary pancreatiti  COMMENTS:       PROCEDURE: CT  - CT ABDOMEN AND PELVIS W0  - Nov 05 2011  3:25PM     RESULT: Comparison: Abdominal ultrasound 10/30/1911    Technique: Multiple axial images from the lung bases to the symphysis   pubis were obtained with oral and without intravenous contrast.    Findings:  There are small bilateral pleural effusions.  Bibasilar opacities are   likely secondary to atelectasis. There is a small nodular density in the   anterior left upper lobe which may represent atelectasis, is     indeterminate. This is seen on image one and measures 8 mm. There are   calcifications in the coronary arteries.    Lack of intravenous contrast limits evaluation of the solid abdominal   organs.  Grossly, the adrenals and pancreas are unremarkable. There is   diffuse anasarca with mild mesenteric stranding and a small amount of   fluid seen in the paracolic gutters. There is a small amount of fluid in   the pelvis. Focal calcifications in the spleen are likely sequela of old   prior infection. The gallbladder is enlarged and heterogeneous. There is   stranding surrounding gallbladder as well as bladder wall thickening.   Findings are concerning for acute cholecystitis. There is suggestion of   mild low-attenuation in the liver adjacent to the gallbladder which may   be infectious or inflammatory related to the gallbladder disease.    No renal calculi or hydronephrosis. The small and large bowel are normal     in caliber. A  right femoral vascular catheter is present, with tip in the   inferior SVC. The appendix is not visualized. The bladder is decompressed   with a Foley catheter. Air the bladder is likely related to the   catheterization. There is a small amount of fluid along the right   spermatic cord.    No aggressive lytic or sclerotic osseous lesions are identified.    IMPRESSION:   1. Findings concerning for acute cholecystitis. There is suggestion of a   small amount of low-attenuation within the liver adjacent to the   gallbladder which is nonspecific and may be infectious or inflammatory   related to the gallbladder disease. This is poorly evaluated secondary to   lack of intravenous contrast.  2. Grossly, the pancreas is unremarkable on this noncontrast study. There     is a small amount of fluid in the  paracolic gutters and in the pelvis   which may be related to the patient's anasarca, but could be related to   pancreatitis.  3. Small nodular density in the anterior left upper lobe may be related   to atelectasis. However, followup noncontrast chest CT is recommended in   3 months to ensure resolution.          Verified By: Gregor Hams, M.D., MD   Assessment/Plan:  Assessment/Plan:   Assessment 1) biliary pancreatitis-symptomatically resolced? Improving lfts, but continued elevation of lipase.  abd ct with continued concern for cholecystitis, possible microlithiasis a potential recurrent problem in chronic pancreatitis. no ct evidence of pancreatic mass, though non-contrasted. No overt evidence of biliary obstruction, choledocholithiasis. 2) renal insufficiency    Plan 1)awaiting surgical opinion of CT findings. If not a candidate for surgery, then likely not a candidate for ERCP.  ? palliative care consult.   Electronic Signatures: Loistine Simas (MD)  (Signed 22-Apr-13 16:57)  Authored: Chief Complaint, VITAL SIGNS/ANCILLARY NOTES, Brief Assessment, Lab Results, Radiology Results, Assessment/Plan   Last Updated: 22-Apr-13 16:57 by Loistine Simas (MD)

## 2014-11-07 NOTE — Discharge Summary (Signed)
PATIENT NAME:  Trevor [name], Trevor [name] MR#:  [id number] DATE OF BIRTH:  [birth year]  DATE OF ADMISSION:  10/30/2011 DATE OF DISCHARGE:  11/08/2011  HISTORY OF PRESENT ILLNESS: This 81 year old male was admitted emergently with chief complaint of right flank pain. He had developed right side discomfort and also a temperature of 101 and also had vomiting and also developed jaundice. He had ultrasound findings of thickened gallbladder wall at 9.4 mm, pericholecystic fluid and also gallstones with mobile echogenic masslike area within the gallbladder representing sludge.   PAST MEDICAL HISTORY:  1. Coronary artery disease. 2. Complete heart block. 3. Cardiomyopathy. 4. Congestive heart failure.  5. Hyperlipidemia.  6. Chronic kidney disease. 7. Chronic obstructive pulmonary disease. 8. Diabetes. 9. Hypertension. 10. Anemia. Details are recorded on the typed history and physical.   MEDICATIONS: Medicines are recorded on the typed history and physical.   PHYSICAL EXAMINATION: VITAL SIGNS: Weight 165, blood pressure 186/48, temperature 98.0, pulse 81. LUNGS: Lungs sounds are clear. HEART: Regular rhythm, S1 and S2. ABDOMEN: Soft, flat, nontender with no palpable mass.   LABORATORY, DIAGNOSTIC AND RADIOLOGICAL DATA: Total bilirubin 9.1, albumin 2.7, creatinine 4.6, BUN 90.   HOSPITAL COURSE: He was admitted with a diagnosis of gallstones, abdominal pain, jaundice, failure to thrive, dehydration. He was admitted and placed on IV fluids for hydration.   We had him see Dr. Marva Panda for gastroenterology consultation. He recommended we consider M.R.C.P., however, with the patient's pacemaker it was not possible to do M.R.C.P. We did have him get a CT scan and findings were concerning for acute cholecystitis. There was a small area of low attenuation within the liver adjacent to the gallbladder. Pancreas appeared to be unremarkable.   Dr. Cherylann Ratel was consulted due to acute renal failure and patient did have  inguinal dialysis catheter inserted and did have hemodialysis but with time eventually started to pass more urine and renal function improved.   It is noted during the course of his hospital stay his bilirubin did gradually improve. He did have some elevation of his lipase suggesting a component of pancreatitis but also was considered his renal failure might affect his lipase findings.   It is further noted that Dr. Winona Legato saw him for internal medicine consultation and started him on intravenous antibiotics due to possibility of cholangitis.   He did gradually improve and was started on initially a clear liquid diet and later advanced to low-fat full liquids. Eventually it was determined he no longer needed dialysis. He was subsequently in satisfactory condition for discharge tolerating his diet, moving his bowels and no longer having any abdominal pain and having no fever.   It was determined that there was fairly substantial risk to surgery and seeing that his symptoms resolved decision was made to discharge and give time for resolution and recovery of the current illness and then later see in the office to further evaluate his progress.   FINAL DISCHARGE DIAGNOSES:  1. Cholecystitis, cholelithiasis.  2. Acute suppurative cholangitis. 3. Obstructive jaundice. 4. Acute dehydration.  5. Acute renal failure.  6. Anasarca. 7. Congestive heart failure.  DISCHARGE MEDICATIONS: 1. Lunesta 2 mg at bedtime.  2. Glipizide 5 mg daily.  3. Aspirin 81 mg daily.  4. Vitamin D2 50,000 units weekly.  5. Docusate sodium 100 mg daily. 6. Tramadol 50 mg every six hours p.r.n. for pain.  7. Metoprolol 12.5 mg b.i.d.  8. Lasix 40 mg daily.   DIET: Take a low-fat diet.  ACTIVITY: As tolerated.  FOLLOW UP: He is going to see Dr. Thedore MinsSingh in the office for follow-up nephrology evaluation and see Dr. Renda RollsWilton Smith in the office for follow-up evaluation of gallbladder  disease.  ____________________________ J. Renda RollsWilton Smith, MD jws:cms D: 11/20/2011 21:13:49 ET T: 11/21/2011 10:45:32 ET JOB#: [id number]  cc: Adella HareJ. Wilton Smith, MD, <Dictator> Neomia Dearavid N. Harrington Challengerhies, MD Adella HareWILTON J SMITH MD ELECTRONICALLY SIGNED 11/22/2011 20:23

## 2014-11-07 NOTE — Consult Note (Signed)
Pt much more oriented today, knows he is in hospital, knows his daughter in the room.  No abd pain, tol full liquid diet.  No abd tenderness in RUQ on my exam.  Will repeat LFT's in morning.  eyes look a little yellow. No new plans otherwise.  No MRCP due to pacemaker.  If TB still elevated may need ERCP.  Electronic Signatures: Scot JunElliott, Robert T (MD)  (Signed on 21-Apr-13 10:53)  Authored  Last Updated: 21-Apr-13 10:53 by Scot JunElliott, Robert T (MD)

## 2014-11-07 NOTE — Consult Note (Signed)
Patient somewhat confused this morning, thinks he is at home, daughter is with him and said he did not sleep much last night and has confusion off and on.  Denies any SOB, no abd pain.  chest is clear and abd is non tender at this time, soft abd.VSS afebrile, O2 sat 92% on RA.  Lipase 738, was 974, alb only 1.5, TB 3.4, hgb 8.  Pt getting some dialysis during last few days.  Pacemaker prevents MRCP use.  May be candidate for ERCP if TB does not fall any more.  I will follow with you.      {  Electronic Signatures: Scot JunElliott, Nancyjo Givhan T (MD)  (Signed on 20-Apr-13 08:56)  Authored  Last Updated: 20-Apr-13 08:56 by Scot JunElliott, Estrella Alcaraz T (MD)

## 2014-11-07 NOTE — Consult Note (Signed)
Chief Complaint:   Subjective/Chief Complaint feeling better today, denies abd pain or nausea, no emesis.   VITAL SIGNS/ANCILLARY NOTES: **Vital Signs.:   17-Apr-13 13:30   Vital Signs Type Routine   Temperature Temperature (F) 97.8   Celsius 36.5   Temperature Source oral   Pulse Pulse 80   Pulse source per Dinamap   Respirations Respirations 18   Systolic BP Systolic BP 500   Diastolic BP (mmHg) Diastolic BP (mmHg) 59   Mean BP 72   BP Source Dinamap   Pulse Ox % Pulse Ox % 94   Pulse Ox Activity Level  At rest   Oxygen Delivery Room Air/ 21 %   Brief Assessment:   Cardiac Regular    Respiratory clear BS    Gastrointestinal details normal Soft  Nontender  Nondistended  No masses palpable  Bowel sounds normal   Routine Chem:  17-Apr-13 04:08    Glucose, Serum 210   BUN 106   Creatinine (comp) 4.68   Sodium, Serum 130   Chloride, Serum 94   CO2, Serum 22   Calcium (Total), Serum 7.9  Hepatic:  17-Apr-13 04:08    Bilirubin, Total 6.8   Alkaline Phosphatase 292   SGPT (ALT) 26   Total Protein, Serum 5.5   Albumin, Serum 1.9  Routine Chem:  17-Apr-13 04:08    Osmolality (calc) 300   eGFR (African American) 12   eGFR (Non-African American) 10   Anion Gap 14  Routine Hem:  17-Apr-13 04:08    WBC (CBC) 10.8   RBC (CBC) 3.20   Hemoglobin (CBC) 9.9   Hematocrit (CBC) 29.7   Platelet Count (CBC) 74   MCV 93   MCH 31.0   MCHC 33.4   RDW 14.5   Neutrophil % 90.0   Lymphocyte % 4.2   Monocyte % 5.1   Eosinophil % 0.5   Basophil % 0.2   Neutrophil # 9.8   Lymphocyte # 0.5   Monocyte # 0.5   Eosinophil # 0.1   Basophil # 0.0  Routine Chem:  17-Apr-13 04:08    Lipase 1237   Potassium, Serum 3.8  Hepatic:  17-Apr-13 04:08    SGOT (AST) 40   Bilirubin, Direct 5.5  Routine UA:  17-Apr-13 10:20    Color (UA) Amber   Clarity (UA) Turbid   Glucose (UA) 50 mg/dL   Bilirubin (UA) 1+   Ketones (UA) Negative   Specific Gravity (UA) 1.012   Blood (UA) 2+    pH (UA) 5.0   Protein (UA) 30 mg/dL   Nitrite (UA) Negative   Leukocyte Esterase (UA) Negative   RBC (UA) 14 /HPF   WBC (UA) 35 /HPF   Bacteria (UA) 2+   Transitional Epithelial (UA) <1 /HPF   Mucous (UA) PRESENT   Hyaline Cast (UA) 50 /LPF   Granular Cast (UA) 38 /LPF   Amorphous Crystal (UA) PRESENT   Assessment/Plan:  Assessment/Plan:   Assessment 1) abnormal lfts, biliary pancreatitis.  symptoms much improved, labls still elevated, but improving.   2) acute on chronic renal failure-pephro following.    Plan 1) will need ct with pancreatic protol, however, cannot at present contrast the study and may not be able to even with improvement of creat as there is baseline ckd.  Will order MRCP.   Electronic Signatures: Loistine Simas (MD)  (Signed 17-Apr-13 15:22)  Authored: Chief Complaint, VITAL SIGNS/ANCILLARY NOTES, Brief Assessment, Lab Results, Assessment/Plan   Last Updated: 17-Apr-13 15:22 by Gustavo Lah,  Hassell Done (MD)

## 2014-11-08 LAB — BASIC METABOLIC PANEL
Anion Gap: 11 (ref 7–16)
BUN: 30 mg/dL — ABNORMAL HIGH
CO2: 28 mmol/L
Calcium, Total: 8.1 mg/dL — ABNORMAL LOW
Chloride: 101 mmol/L
Creatinine: 2 mg/dL — ABNORMAL HIGH
EGFR (African American): 32 — ABNORMAL LOW
EGFR (Non-African Amer.): 28 — ABNORMAL LOW
Glucose: 179 mg/dL — ABNORMAL HIGH
POTASSIUM: 3.1 mmol/L — AB
SODIUM: 140 mmol/L

## 2014-11-08 LAB — CBC WITH DIFFERENTIAL/PLATELET
Basophil #: 0.1 10*3/uL (ref 0.0–0.1)
Basophil %: 1.1 %
Eosinophil #: 0.4 10*3/uL (ref 0.0–0.7)
Eosinophil %: 5.6 %
HCT: 25.6 % — ABNORMAL LOW (ref 40.0–52.0)
HGB: 7.6 g/dL — ABNORMAL LOW (ref 13.0–18.0)
Lymphocyte #: 2.2 10*3/uL (ref 1.0–3.6)
Lymphocyte %: 28.1 %
MCH: 22.5 pg — ABNORMAL LOW (ref 26.0–34.0)
MCHC: 29.5 g/dL — ABNORMAL LOW (ref 32.0–36.0)
MCV: 76 fL — AB (ref 80–100)
Monocyte #: 0.5 x10 3/mm (ref 0.2–1.0)
Monocyte %: 6.5 %
NEUTROS ABS: 4.6 10*3/uL (ref 1.4–6.5)
NEUTROS PCT: 58.7 %
PLATELETS: 292 10*3/uL (ref 150–440)
RBC: 3.35 10*6/uL — AB (ref 4.40–5.90)
RDW: 19.2 % — ABNORMAL HIGH (ref 11.5–14.5)
WBC: 7.8 10*3/uL (ref 3.8–10.6)

## 2014-11-09 LAB — BASIC METABOLIC PANEL
Anion Gap: 6 — ABNORMAL LOW (ref 7–16)
BUN: 33 mg/dL — AB
CREATININE: 2.04 mg/dL — AB
Calcium, Total: 7.9 mg/dL — ABNORMAL LOW
Chloride: 103 mmol/L
Co2: 31 mmol/L
EGFR (Non-African Amer.): 27 — ABNORMAL LOW
GFR CALC AF AMER: 31 — AB
Glucose: 155 mg/dL — ABNORMAL HIGH
POTASSIUM: 3.8 mmol/L
SODIUM: 140 mmol/L

## 2014-11-09 LAB — CBC WITH DIFFERENTIAL/PLATELET
BASOS ABS: 0.1 10*3/uL (ref 0.0–0.1)
Basophil %: 1.7 %
EOS PCT: 7.8 %
Eosinophil #: 0.5 10*3/uL (ref 0.0–0.7)
HCT: 23.7 % — ABNORMAL LOW (ref 40.0–52.0)
HGB: 7.2 g/dL — ABNORMAL LOW (ref 13.0–18.0)
LYMPHS ABS: 1.3 10*3/uL (ref 1.0–3.6)
Lymphocyte %: 22.1 %
MCH: 23.1 pg — ABNORMAL LOW (ref 26.0–34.0)
MCHC: 30.5 g/dL — ABNORMAL LOW (ref 32.0–36.0)
MCV: 76 fL — AB (ref 80–100)
Monocyte #: 0.3 x10 3/mm (ref 0.2–1.0)
Monocyte %: 5.9 %
NEUTROS ABS: 3.6 10*3/uL (ref 1.4–6.5)
Neutrophil %: 62.5 %
PLATELETS: 249 10*3/uL (ref 150–440)
RBC: 3.13 10*6/uL — ABNORMAL LOW (ref 4.40–5.90)
RDW: 19.1 % — AB (ref 11.5–14.5)
WBC: 5.8 10*3/uL (ref 3.8–10.6)

## 2014-11-10 ENCOUNTER — Encounter
Admit: 2014-11-10 | Disposition: A | Discharge status: Home / Self Care | Payer: Self-pay | Attending: Internal Medicine | Admitting: Internal Medicine

## 2014-11-10 LAB — CBC WITH DIFFERENTIAL/PLATELET
BASOS ABS: 0.1 10*3/uL (ref 0.0–0.1)
Basophil %: 1 %
EOS ABS: 0.6 10*3/uL (ref 0.0–0.7)
Eosinophil %: 9 %
HCT: 25.7 % — ABNORMAL LOW (ref 40.0–52.0)
HGB: 7.5 g/dL — AB (ref 13.0–18.0)
Lymphocyte #: 1.6 10*3/uL (ref 1.0–3.6)
Lymphocyte %: 26.1 %
MCH: 22.1 pg — AB (ref 26.0–34.0)
MCHC: 29 g/dL — ABNORMAL LOW (ref 32.0–36.0)
MCV: 76 fL — AB (ref 80–100)
MONO ABS: 0.5 x10 3/mm (ref 0.2–1.0)
Monocyte %: 7.2 %
Neutrophil #: 3.5 10*3/uL (ref 1.4–6.5)
Neutrophil %: 56.7 %
PLATELETS: 251 10*3/uL (ref 150–440)
RBC: 3.37 10*6/uL — ABNORMAL LOW (ref 4.40–5.90)
RDW: 18.9 % — AB (ref 11.5–14.5)
WBC: 6.2 10*3/uL (ref 3.8–10.6)

## 2014-11-10 LAB — BASIC METABOLIC PANEL
Anion Gap: 6 — ABNORMAL LOW (ref 7–16)
BUN: 32 mg/dL — ABNORMAL HIGH
CALCIUM: 7.9 mg/dL — AB
CO2: 31 mmol/L
CREATININE: 2.09 mg/dL — AB
Chloride: 102 mmol/L
EGFR (African American): 30 — ABNORMAL LOW
EGFR (Non-African Amer.): 26 — ABNORMAL LOW
Glucose: 154 mg/dL — ABNORMAL HIGH
Potassium: 3.8 mmol/L
Sodium: 139 mmol/L

## 2014-11-14 NOTE — Discharge Summary (Signed)
PATIENT NAME:  Trevor [name], Dekker A MR#:  [id number] DATE OF BIRTH:  15-Mar-1918  DATE OF ADMISSION:  11/07/2014 DATE OF DISCHARGE:  11/10/2014  ADMITTING COMPLAINT: Shortness of breath.   DISCHARGE DIAGNOSES: 1. Acute on chronic systolic congestive heart failure, ejection fraction 25-30%.  2. Generalized anasarca due to heart failure and chronic kidney disease.  3. Chronic kidney disease stage III.  4. Elevated troponin due to supply and demand ischemia.  5. Anemia of chronic disease with a baseline hemoglobin around 7.5.  6. Diabetes mellitus type 2.  7. History of deep vein thrombosis.  8. Hypertension.  9. Coronary artery disease with a history of bypass grafting.   CONSULTATIONS: 1. Harold HedgeKenneth Fath, MD, cardiology.  2. Ned GraceNancy Phifer, MD palliative care.   PROCEDURES: 1. Chest x-ray April 24 shows hazy perihilar opacities likely indicating pulmonary edema with moderate pleural effusions.  2. Chest x-ray April 26 shows increased moderate to moderately large bilateral effusions, greater on the left with associated basilar atelectasis. No change in pulmonary edema.   HISTORY OF PRESENT ILLNESS: This 81 year old gentleman with known history of congestive heart failure with ejection fraction 25-35% is admitted for shortness of breath due to acute on chronic systolic heart failure. He has been gaining weight for at least 1 week. He does not check his weight but notes that his clothes do not fit him.  He had to change belt sizes and pant sizes. He has been feeling weak. No fevers, chills, cough, or syncope. He has tried to increase his home Lasix dose without any improvement.   HOSPITAL COURSE BY PROBLEM:  1. Acute on chronic systolic congestive heart failure exacerbation, ejection fraction 25-35%:   On admission, the patient was started on IV Lasix with good diuresis.  He was seen by cardiology, Dr. Lady GaryFath. No further ischemic work-up was advised. On the day of discharge, his body weight is 81 kg, down  from 83 on admission.  He is being discharged back on his home dose of 20 mg of Lasix twice a day, low sodium diet, and follow up with the congestive heart failure clinic on May 24.  2. Chronic kidney disease stage III.  Baseline creatinine is around 2 and was stable throughout the hospitalization. He has a GFR of approximately 30.  3. Diabetes mellitus type 2: He was maintained on sliding scale insulin throughout the hospitalization with moderately high blood sugars in the 150-200 range. Goal hemoglobin A1c in this age group is to be less than 8. Hemoglobin A1c was not checked during this hospitalization. He continues on glipizide. 4. Hypertension: Blood pressure is well controlled on Lasix throughout the hospitalization.  5. Elevated troponins: He had no EKG changes or events on telemetry to suggest an acute cardiac event. Troponin was 0.06, very mild elevation. He was seen by cardiology and no further ischemic work-up was planned. This is most likely due to strain due to acute congestive heart failure exacerbation.  6. Anemia of chronic disease: His baseline hemoglobin is somewhere between 7.5 and 7.9. He was stable. No signs of bleeding.  7. Deconditioning: The patient was seen by physical therapy and skilled nursing facility placement was recommended for additional physical rehabilitation. Family is in agreement and he is being discharged to skilled nursing today.   DISCHARGE PHYSICAL EXAMINATION: VITAL SIGNS: Temperature 98.2, pulse 71, respirations 20, blood pressure 158/64, oxygenation 100% on 2 liters, 91 on room air.  GENERAL: No acute distress.  CARDIOVASCULAR: Regular rate and rhythm. No murmurs, rubs, or  gallops. No peripheral edema. Peripheral pulses 1+.  RESPIRATORY: Lungs are clear to auscultation bilaterally with good air movement. No respiratory distress. No rhonchi, wheezes, or rales.  PSYCHIATRIC: He is alert and oriented with fair insight into his clinical condition. He is very hard  of hearing.   LABORATORY DATA: Sodium 139, potassium 3.8, chloride 102, bicarbonate 31, BUN 32, creatinine 2.0, glucose 154. White blood cells 6.2, hemoglobin 7.5, platelets 251,000. MCV is 76.   DISCHARGE MEDICATIONS: 1. Glipizide 5 mg 1 tablet daily.  2. Vitamin D2 at 50,000 international units 1 tablet every 2 weeks.  3. Acetaminophen 325 mg 2 tablets orally every 6 hours as needed for pain.  4. Allopurinol 100 mg 1 tablet once a day for gout.  5. Aspirin 81 mg 1 tablet daily.  6. Eszopiclone 2 mg 1 tablet once a day at bedtime.  7. Mirtazapine 15 mg 1/2 tablet once a day in the evening.  8. Gabapentin 100 mg 1 tablet twice a day.  9. Furosemide 20 mg 1 tablet twice a day.   Note that Coumadin was discontinued during this hospitalization by cardiology due to fall and bleeding risk as well as severe anemia.   CONDITION ON DISCHARGE: Stable.   DISPOSITION: The patient is being discharged to skilled nursing facility.   DISCHARGE INSTRUCTIONS:  1. Diet: Low sodium, low fat, low cholesterol, carbohydrate controlled diet.  2. Activity limitations:  As tolerated per physical therapy.  3. Time frame for followup:  Within 1-2 weeks with Dr. Mickey Farber.  Follow up within 2-4 weeks with Dr. Lady Gary.   TIME SPENT ON DISCHARGE: 45 minutes.     ____________________________ Ena Dawley. Clent Ridges, MD cpw:tr D: 11/10/2014 11:54:15 ET T: 11/10/2014 12:21:41 ET JOB#: [id number]  cc: Santina Evans P. Clent Ridges, MD, <Dictator> Neomia Dear. Harrington Challenger, MD Darlin Priestly Lady Gary, MD Gale Journey MD ELECTRONICALLY SIGNED 11/11/2014 7:34

## 2014-11-14 NOTE — H&P (Signed)
PATIENT NAME:  Trevor Trevor [name], Trevor Trevor [name] MR#:  [id number] DATE OF BIRTH:  19-Dec-1917  DATE OF ADMISSION:  11/07/2014  PRIMARY CARE PHYSICIAN:  Trevor Trevor [name], M.D.   CARDIOLOGY:  Trevor Trevor [name]. Lady GaryFath, M.D.   REQUESTING PHYSICIAN: Trevor Trevor [name], M.D.    CHIEF COMPLAINT: Shortness of breath.   HISTORY OF PRESENT ILLNESS: The patient is Trevor [name] with Trevor [name] known history of chronic systolic heart failure with EF of 25 to 35%. He is being admitted for acute on chronic systolic heart failure exacerbation. The patient has been gaining weight over the last week or so.  The patient's family is not checking his weight, but feel that his clothes are not able to fit him.  They had to buy new pants.  His belt also had to change position, was on the 3rd hold, now the 6th hole or last hole as it will not fit him.  He has been also feeling more and more weak and has been feeling swollen all over his body from feet until upper part of the chest. He denies any fever. He does have cough with some mucus, but not able to expectorate this.  Is very weak. He was normally taking 20 mg Lasix daily per his family doctor, but for the last 1 week or so his family member increased it to 20 mg tablet twice Trevor [name] day and still not much different, then they brought him to the Emergency Department for further evaluation and management.   PAST MEDICAL HISTORY:  1.  Chronic systolic heart failure with and EF of 25 to 35%.  2.  Coronary artery disease status post bypass grafting.  3.  Type 2 diabetes. 4.  CKD stage III. 5.  Hypertension.  6.  Anemia of chronic kidney disease.   PAST SURGICAL HISTORY:  1.  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 2.  Appendectomy.  3.  Surgery of the right 5th metacarpal bone. 4.  Left hip nail and pinning. 5.  Right hip replacement with subsequent revisions.   SOCIAL HISTORY: He lives with his daughter Trevor Trevor [name]. He also visits daily.  Uses Trevor [name] wheelchair or walker for ambulation.  Does not smoke  cigarettes or drink alcohol or any illicit substances.   FAMILY HISTORY:  1.  Mother died of possible stomach cancer.  2.  Father died of intestinal rupture.   ALLERGIES: No known drug allergies.   ALLERGIES: AMBIEN,  MEDICATIONS AT HOME:  1.  Medical record is complete.  As per the last medical record, he takes vitamin D2 50,000 international units 1 capsule every 2 weeks. 2.  Mirtazapine 15 mg 1/2 tablet p.o. at bedtime. 3.  Lunesta 2 mg p.o. at bedtime.  4.  Glipizide 5 mg p.o. daily.  5.  Lasix 20 mg 1 to 2 tablets daily. 6.  Allopurinol 100 mg 1/2 tablet p.o. daily.  REVIEW OF SYSTEMS:  CONSTITUTIONAL: No fever, positive fatigue and weakness.  Also some weight gain over the last week or 2.   EYES:  No blurred or double vision.  HEENT:  No tinnitus or ear pain. He does have difficulty hearing.  He has no pain in her eyes or ears. He has Trevor [name].  He wears Trevor [name] hearing aid on the right, deaf in the left ear. No sore throat, difficulty swallowing.  RESPIRATORY: No cough, wheezing, hemoptysis.  CARDIOVASCULAR: No chest pain, orthopnea, positive for significant edema and anasarca. GASTROINTESTINAL: No nausea or vomiting or diarrhea.  GENITOURINARY:  No dysuria or hematuria.  ENDOCRINE: No polyuria or nocturia.  HEMATOLOGY: Anemia of chronic kidney disease.  No easy bruising or bleeding.  SKIN: No obvious rash, lesion or ulcer.  MUSCULOSKELETAL: No joint pain.  He does have Trevor [name].  NEUROLOGIC:  No tingling, numbness or weakness.  PSYCHIATRY: No history of anxiety or depression.   PHYSICAL EXAMINATION:  VITAL SIGNS: Temperature 97.8, heart rate 70 per minute, respirations 18 per minute, blood pressure 149/56. He is saturating 97% on room air.  GENERAL: The patient is Trevor [name] lying in the bed comfortably without any acute distress.  EYES: Pupils equal, round, reactive to light and accommodation. No scleral icterus. Extraocular  muscles intact.  HEENT: Head atraumatic, normocephalic. Oropharynx and nasopharynx clear.  NECK: Supple. No jugular venous distention. No thyroid enlargement or tenderness.  LUNGS: Clear to auscultation bilaterally. No wheezing, rales, rhonchi, or crepitation.  CARDIOVASCULAR: S1, S2 normal. No murmurs.  ABDOMEN: Soft, nontender, nondistended. Bowel sounds present. No organomegaly or mass.  EXTREMITIES: He has 3 to 4+ pedal edema bilaterally. No cyanosis or clubbing. He has generalized anasarca with fluid up to mid chest.  NEUROLOGIC:  Cranial nerves II through XII intact.  Muscle strength 4/5 in all extremities. Sensation intact.  PSYCHIATRIC: The patient is alert and oriented x 3. He does have significant difficulty hearing and difficult communication also, but no obvious signs of uncontrolled depression or anxiety.   LABORATORY PANEL: CBC showed white count 6.8, hemoglobin 7.9, hematocrit 27.5, platelet 267,000.  Troponin of 0.06.  BMP within normal limits, except BUN of 32, creatinine 2.08.  Blood sugar of 200.  Serum calcium of 8.1.   Chest x-ray showed pulmonary edema with moderate pleural effusion.   EKG shows Trevor [name] paced rhythm.   IMPRESSION AND PLAN:  1. Acute on chronic systolic heart failure exacerbation with ejection fraction of 25 to 35%. Will start him on IV Lasix 40 twice Trevor [name] day. Daily weights will be checked.  Strict input and outputs.  Will consult cardiology. Monitor him on telemetry.  2. Generalized anasarca likely due to Trevor [name] combination of heart failure and chronic kidney disease stage III.  Will monitor his kidney function closely with aggressive diuresis as this can get worse.  May need nephrology consultation. 3. Elevated troponin likely due to supply/demand ischemia. Will hold off further anticoagulation at this time unless troponin trends upwards. 4. Chronic kidney disease stage III.  Creatinine certainly could get worse with aggressive diuresis. His creatinine at this time  seems close to his baseline which is around 2.0.  CODE STATUS: FULL CODE.   TIME SPENT: Total time taking care of this patient is 40 minutes.   ____________________________ Ellamae Sia. Sherryll Burger, MD vss:sp D: 11/07/2014 16:24:36 ET T: 11/07/2014 17:02:01 ET JOB#: [id number]  cc: Iantha Fallen Trevor [name]. Lady Gary, MD Trevor Dear. Harrington Challenger, MD Derrick Tiegs S. Sherryll Burger, MD, <Dictator> Ellamae Sia Navos MD ELECTRONICALLY SIGNED 11/11/2014 11:17

## 2014-11-14 NOTE — Consult Note (Signed)
PATIENT NAME:  Trevor [name], Trevor [name] MR#:  [id number] DATE OF BIRTH:  [birth year]  DATE OF CONSULTATION:  11/07/2014  REFERRING PHYSICIAN:   CONSULTING PHYSICIAN:  Cordney Barstow S. Sherryll BurgerShah, MD  PRIMARY CARE PHYSICIAN:  Neomia Dearavid N. Harrington Challengerhies, M.D.   CARDIOLOGY:  Darlin PriestlyKenneth [name]. Lady GaryFath, M.D.   REQUESTING PHYSICIAN: Su Leyobert L. Kinner, M.D.    CHIEF COMPLAINT: Shortness of breath.   HISTORY OF PRESENT ILLNESS: The patient is [name] with [name] known history of chronic systolic heart failure with EF of 25 to 35%. He is being admitted for acute on chronic systolic heart failure exacerbation. The patient has been gaining weight over the last week or so.  The patients family is not checking his weight, but feel that his clothes are not able to fit him.  They had to buy new pants.  His belt also had to change position, was on the 3rd hold, now the 6th hole or last hole as it will not fit him.  He has been also feeling more and more weak and has been feeling swollen all over his body from feet until upper part of the chest. He denies any fever. He does have cough with some mucus, but not able to expectorate this.  Is very weak. He was normally taking 20 mg Lasix daily per his family doctor, but for the last 1 week or so his family member increased it to 20 mg tablet twice [name] day and still not much different, then they brought him to the Emergency Department for further evaluation and management.   PAST MEDICAL HISTORY:  1.  Chronic systolic heart failure with and EF of 25 to 35%.  2.  Coronary artery disease status post bypass grafting.  3.  Type 2 diabetes. 4.  CKD stage III. 5.  Hypertension.  6.  Anemia of chronic kidney disease.   PAST SURGICAL HISTORY:  1.  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 2.  Appendectomy.  3.  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 4.  Left hip nail and pinning. 5.  Right hip replacement with subsequent revisions.   SOCIAL HISTORY: He lives with his  daughter Darel HongJudy. He also visits daily.  Uses [name] wheelchair or walker for ambulation.  Does not smoke cigarettes or drink alcohol or any illicit substances.   FAMILY HISTORY:  1.  Mother died of possible stomach cancer.  2.  Father died of intestinal rupture.   ALLERGIES: No known drug allergies.   ALLERGIES: AMBIEN,  MEDICATIONS AT HOME:  1.  Medical record is complete.  As per the last medical record, he takes vitamin D2 50,000 international units 1 capsule every 2 weeks. 2.  Mirtazapine 15 mg 1/2 tablet p.o. at bedtime. 3.  Lunesta 2 mg p.o. at bedtime.  4.  Glipizide 5 mg p.o. daily.  5.  Lasix 20 mg 1 to 2 tablets daily. 6.  Allopurinol 100 mg 1/2 tablet p.o. daily.  REVIEW OF SYSTEMS:  CONSTITUTIONAL: No fever, positive fatigue and weakness.  Also some weight gain over the last week or 2.   EYES:  No blurred or double vision.  HEENT:  No tinnitus or ear pain. He does have difficulty hearing.  He has no pain in her eyes or ears. He has [name].  [name], deaf in the left ear. No sore throat, difficulty swallowing.  RESPIRATORY: No cough, wheezing, hemoptysis.  CARDIOVASCULAR: No chest pain, orthopnea, positive for  significant edema and anasarca. GASTROINTESTINAL: No nausea or vomiting or diarrhea.  GENITOURINARY:  No dysuria or hematuria.  ENDOCRINE: No polyuria or nocturia.  HEMATOLOGY: Anemia of chronic kidney disease.  No easy bruising or bleeding.  SKIN: No obvious rash, lesion or ulcer.  MUSCULOSKELETAL: No joint pain.  He does have [name].  NEUROLOGIC:  No (Dictation Anomaly)<<MISSING TEXT>> weakness.  PSYCHIATRY: No history of anxiety or depression.   PHYSICAL EXAMINATION:  VITAL SIGNS: Temperature 97.8, heart rate 70 per minute, respirations 18 per minute, blood pressure 149/56. He is saturating 97% on room air.  GENERAL: The patient is [name] lying in the bed comfortably without any acute  distress.  EYES: Pupils equal, round, reactive to light and accommodation. No scleral icterus. Extraocular muscles intact.  HEENT: Head atraumatic, normocephalic. Oropharynx and nasopharynx clear.  NECK: Supple. No jugular venous distention. No thyroid enlargement or tenderness.  LUNGS: Clear to auscultation bilaterally. No wheezing, rales, rhonchi, or crepitation.  CARDIOVASCULAR: S1, S2 normal. No murmurs.  ABDOMEN: Soft, nontender, nondistended. Bowel sounds present. No organomegaly or mass.  EXTREMITIES: He has 3 to 4+ pedal edema bilaterally. No cyanosis or clubbing. He has generalized anasarca with fluid up to mid chest.  NEUROLOGIC:  Cranial nerves II through XII intact.  Muscle strength 4/5 in all extremities. Sensation intact.  PSYCHIATRIC: The patient is alert and oriented x 3. He does have significant difficulty hearing and difficult communication also, but no obvious signs of uncontrolled depression or anxiety.   LABORATORY PANEL: CBC showed white count 6.8, hemoglobin 7.9, hematocrit 27.5, platelet 267,000.  Troponin of 0.06.  BMP within normal limits, except BUN of 32, creatinine 2.08.  Blood sugar of 200.  Serum calcium of 8.1.   Chest x-ray showed pulmonary edema with moderate pleural effusion.   EKG shows [name] paced rhythm.   IMPRESSION AND PLAN:  1.  Acute on chronic systolic heart failure exacerbation with ejection fraction of 25 to 35%. Will start him on IV Lasix 40 twice [name] day. Daily weights will be checked.  Strict input and outputs.  Will consult cardiology. Monitor him on telemetry.  2.  Generalized anasarca likely due to [name] combination of heart failure and chronic kidney disease stage III.  Will monitor his kidney function closely with aggressive diuresis as this can get worse.  May need nephrology consultation. 3.  Elevated troponin likely due to supply/demand ischemia. Will hold off further anticoagulation at this time unless troponin trends upwards. 4.  Chronic kidney  disease stage III.  Creatinine certainly could get worse with aggressive diuresis. His creatinine at this time seems close to his baseline which is around 2.0.  CODE STATUS: Full code.   Total time taking care of this patient is 40 minutes.    ____________________________ Ellamae Sia. Sherryll Burger, MD vss:sp D: 11/07/2014 16:24:36 ET T: 11/07/2014 17:02:01 ET JOB#: [id number]  cc: Markian Glockner S. Sherryll Burger, MD, <Dictator> Darlin Priestly. Lady Gary, MD Neomia Dear. Harrington Challenger, MD

## 2014-11-14 NOTE — Consult Note (Signed)
   Present Illness 81 YO male with history of cad s/p cabg with lima to lad, svg to om1 and rca, history of as s/p bioprosthetic avr , history of high grade heart block with ppm, history of ischeic cm with ef of 35%, history of copd, chornic kidney disease (creatinine 1.9 1 month ago), history of dvt in the past treated with warfarin which was been sub therapeutic for the pst month due to pcp concern over his anemia. His most recent hgb was 7.2 as outpatient. He was brought to er by dauther after pateint has been doing poorly with nausea, vomiting and poor po intake. In er, was noted ot have pulmonary edema, anemia with hgb of 7.9 and acute on chronic ckd with creatinine os 2.01. Has mild troponin elevation. Paced rhythm. Pt feels better this am and desires to go home. He deies further nausea or cp. He had difficulty standing up at home prior to admission   Physical Exam:  GEN well nourished   NECK No masses   RESP no use of accessory muscles  rhonchi   CARD Regular rate and rhythm  Murmur   Murmur Systolic   Systolic Murmur Out flow   ABD denies tenderness  no Abdominal Bruits   LYMPH negative neck, negative axillae   EXTR positive edema   SKIN normal to palpation   NEURO cranial nerves intact, motor/sensory function intact   PSYCH alert, poor insight   Review of Systems:  Subjective/Chief Complaint nausea   General: Fatigue   ENT: No Complaints   Eyes: No Complaints   Neck: No Complaints   Respiratory: Short of breath   Cardiovascular: Edema   Gastrointestinal: Nausea   Genitourinary: No Complaints   Vascular: No Complaints   Musculoskeletal: No Complaints   Neurologic: No Complaints   Hematologic: No Complaints   Endocrine: No Complaints   Psychiatric: No Complaints   Review of Systems: All other systems were reviewed and found to be negative   Medications/Allergies Reviewed Medications/Allergies reviewed   EKG:  Interpretation v paced rhythm     [place]: Alt Ment Status, Agitation   Impression 81 yo male with history of cad s/p cabg, history of as s/p avr with tissue prosthesis, history of high grade heart block with pacemaker in place, history of ischemic cardiomyopathy with ef of 35%, history of dvt recently on warfarin for this with subtherapeutic inrs, history of anemia with recent hgb of 7.2-7.8, admitted with nausea, weakness, anorexia and noted to have volume overload on cxr. He was anemic with hgb of 7.2, inr was 1.5, acute on chronic ckd with creatinine of 2.2 (1.9 baseline)He has improved overnight and desires to go home. He has not ambulated or stood up. He has a mild troponn elevation which is likely demand ischemia. Will hold warfarin for now as it has been subtherapeutic for the past month and patient is fairly signficantly anemic. His chf has clinically improved somewhat. Would agree with consideration for palliative care eval. Aortic valve replacement appears to be functional at present.   Plan 1. Will suspend warfarin 2. Continue with asa 81 mg daily 3. Continue diuresis but will change to po lasix 40 mg bid 4.ambulate with assistance   Electronic Signatures: Dalia HeadingFath, Kenneth A (MD)  (Signed 25-Apr-16 08:08)  Authored: General Aspect/Present Illness, History and Physical Exam, Review of System, EKG , Allergies, Impression/Plan   Last Updated: 25-Apr-16 08:08 by Dalia HeadingFath, Kenneth A (MD)

## 2014-11-16 ENCOUNTER — Encounter
Admission: RE | Admit: 2014-11-16 | Discharge: 2014-11-16 | Disposition: A | Discharge status: Home / Self Care | Payer: Medicare Other | Source: Ambulatory Visit | Attending: Internal Medicine | Admitting: Internal Medicine

## 2014-11-16 DIAGNOSIS — D649 Anemia, unspecified: Secondary | ICD-10-CM | POA: Diagnosis present

## 2014-11-16 LAB — BASIC METABOLIC PANEL
Anion gap: 10 (ref 5–15)
BUN: 39 mg/dL — ABNORMAL HIGH (ref 6–20)
CALCIUM: 8.3 mg/dL — AB (ref 8.9–10.3)
CO2: 34 mmol/L — ABNORMAL HIGH (ref 22–32)
Chloride: 97 mmol/L — ABNORMAL LOW (ref 101–111)
Creatinine, Ser: 1.85 mg/dL — ABNORMAL HIGH (ref 0.61–1.24)
GFR calc Af Amer: 34 mL/min — ABNORMAL LOW (ref >60)
GFR, EST NON AFRICAN AMERICAN: 30 mL/min — AB (ref >60)
GLUCOSE: 127 mg/dL — AB (ref 65–99)
Potassium: 3.7 mmol/L (ref 3.5–5.1)
Sodium: 141 mmol/L (ref 135–145)

## 2014-12-07 ENCOUNTER — Ambulatory Visit: Payer: Self-pay | Admitting: Family

## 2015-02-28 ENCOUNTER — Other Ambulatory Visit: Payer: Self-pay | Admitting: Otolaryngology

## 2015-02-28 DIAGNOSIS — R0989 Other specified symptoms and signs involving the circulatory and respiratory systems: Secondary | ICD-10-CM

## 2015-02-28 DIAGNOSIS — R09A2 Foreign body sensation, throat: Secondary | ICD-10-CM

## 2015-03-01 ENCOUNTER — Emergency Department
Admission: EM | Admit: 2015-03-01 | Discharge: 2015-03-01 | Disposition: A | Discharge status: Home / Self Care | Attending: Emergency Medicine | Admitting: Emergency Medicine

## 2015-03-01 ENCOUNTER — Encounter: Payer: Self-pay | Admitting: Emergency Medicine

## 2015-03-01 ENCOUNTER — Emergency Department

## 2015-03-01 ENCOUNTER — Other Ambulatory Visit: Payer: Self-pay

## 2015-03-01 DIAGNOSIS — M533 Sacrococcygeal disorders, not elsewhere classified: Secondary | ICD-10-CM | POA: Diagnosis not present

## 2015-03-01 DIAGNOSIS — Z87891 Personal history of nicotine dependence: Secondary | ICD-10-CM | POA: Insufficient documentation

## 2015-03-01 DIAGNOSIS — L89152 Pressure ulcer of sacral region, stage 2: Secondary | ICD-10-CM | POA: Diagnosis not present

## 2015-03-01 DIAGNOSIS — F0390 Unspecified dementia without behavioral disturbance: Secondary | ICD-10-CM | POA: Diagnosis not present

## 2015-03-01 DIAGNOSIS — I129 Hypertensive chronic kidney disease with stage 1 through stage 4 chronic kidney disease, or unspecified chronic kidney disease: Secondary | ICD-10-CM | POA: Insufficient documentation

## 2015-03-01 DIAGNOSIS — E860 Dehydration: Secondary | ICD-10-CM | POA: Insufficient documentation

## 2015-03-01 DIAGNOSIS — E119 Type 2 diabetes mellitus without complications: Secondary | ICD-10-CM | POA: Insufficient documentation

## 2015-03-01 DIAGNOSIS — K529 Noninfective gastroenteritis and colitis, unspecified: Secondary | ICD-10-CM | POA: Diagnosis not present

## 2015-03-01 DIAGNOSIS — N183 Chronic kidney disease, stage 3 (moderate): Secondary | ICD-10-CM | POA: Insufficient documentation

## 2015-03-01 DIAGNOSIS — R197 Diarrhea, unspecified: Secondary | ICD-10-CM | POA: Diagnosis present

## 2015-03-01 LAB — CBC WITH DIFFERENTIAL/PLATELET
BASOS ABS: 0 10*3/uL (ref 0–0.1)
Basophils Relative: 0 %
Eosinophils Absolute: 0 10*3/uL (ref 0–0.7)
Eosinophils Relative: 1 %
HEMATOCRIT: 33.2 % — AB (ref 40.0–52.0)
Hemoglobin: 9.6 g/dL — ABNORMAL LOW (ref 13.0–18.0)
LYMPHS PCT: 15 %
Lymphs Abs: 1 10*3/uL (ref 1.0–3.6)
MCH: 22.1 pg — ABNORMAL LOW (ref 26.0–34.0)
MCHC: 28.9 g/dL — ABNORMAL LOW (ref 32.0–36.0)
MCV: 76.5 fL — ABNORMAL LOW (ref 80.0–100.0)
MONO ABS: 0.3 10*3/uL (ref 0.2–1.0)
Monocytes Relative: 5 %
NEUTROS ABS: 5.3 10*3/uL (ref 1.4–6.5)
Neutrophils Relative %: 79 %
Platelets: 240 10*3/uL (ref 150–440)
RBC: 4.33 MIL/uL — ABNORMAL LOW (ref 4.40–5.90)
RDW: 21.9 % — ABNORMAL HIGH (ref 11.5–14.5)
WBC: 6.6 10*3/uL (ref 3.8–10.6)

## 2015-03-01 LAB — COMPREHENSIVE METABOLIC PANEL
ALT: 8 U/L — AB (ref 17–63)
AST: 43 U/L — AB (ref 15–41)
Albumin: 2.8 g/dL — ABNORMAL LOW (ref 3.5–5.0)
Alkaline Phosphatase: 134 U/L — ABNORMAL HIGH (ref 38–126)
Anion gap: 14 (ref 5–15)
BUN: 45 mg/dL — ABNORMAL HIGH (ref 6–20)
CO2: 33 mmol/L — ABNORMAL HIGH (ref 22–32)
Calcium: 8.1 mg/dL — ABNORMAL LOW (ref 8.9–10.3)
Chloride: 91 mmol/L — ABNORMAL LOW (ref 101–111)
Creatinine, Ser: 2.65 mg/dL — ABNORMAL HIGH (ref 0.61–1.24)
GFR calc Af Amer: 22 mL/min — ABNORMAL LOW (ref >60)
GFR calc non Af Amer: 19 mL/min — ABNORMAL LOW (ref >60)
Glucose, Bld: 173 mg/dL — ABNORMAL HIGH (ref 65–99)
Potassium: 3.4 mmol/L — ABNORMAL LOW (ref 3.5–5.1)
Sodium: 138 mmol/L (ref 135–145)
Total Bilirubin: 1.6 mg/dL — ABNORMAL HIGH (ref 0.3–1.2)
Total Protein: 6.7 g/dL (ref 6.5–8.1)

## 2015-03-01 LAB — URINALYSIS COMPLETE WITH MICROSCOPIC (ARMC ONLY)
Bacteria, UA: NONE SEEN
Bilirubin Urine: NEGATIVE
Glucose, UA: NEGATIVE mg/dL
Hgb urine dipstick: NEGATIVE
Ketones, ur: NEGATIVE mg/dL
Leukocytes, UA: NEGATIVE
Nitrite: NEGATIVE
PROTEIN: NEGATIVE mg/dL
SQUAMOUS EPITHELIAL / LPF: NONE SEEN
Specific Gravity, Urine: 1.014 (ref 1.005–1.030)
pH: 5 (ref 5.0–8.0)

## 2015-03-01 LAB — LIPASE, BLOOD: LIPASE: 310 U/L — AB (ref 22–51)

## 2015-03-01 LAB — TROPONIN I: Troponin I: 0.17 ng/mL — ABNORMAL HIGH (ref <0.031)

## 2015-03-01 MED ORDER — IOHEXOL 240 MG/ML SOLN: 50.0000 mL | INTRAMUSCULAR | Status: AC

## 2015-03-01 MED ORDER — ONDANSETRON HCL 4 MG/2ML IJ SOLN
INTRAMUSCULAR | Status: AC
  Filled 2015-03-01: qty 2

## 2015-03-01 MED ORDER — CIPROFLOXACIN HCL 500 MG PO TABS: 250.0000 mg | ORAL_TABLET | Freq: Two times a day (BID) | ORAL | Status: AC

## 2015-03-01 MED ORDER — SODIUM CHLORIDE 0.9 % IV BOLUS (SEPSIS)
500.0000 mL | Freq: Once | INTRAVENOUS | Status: AC
  Administered 2015-03-01: 500 mL via INTRAVENOUS

## 2015-03-01 MED ORDER — ONDANSETRON HCL 4 MG/2ML IJ SOLN
4.0000 mg | Freq: Once | INTRAMUSCULAR | Status: DC
  Filled 2015-03-01: qty 2

## 2015-03-01 MED ORDER — METRONIDAZOLE 500 MG PO TABS: 500.0000 mg | ORAL_TABLET | Freq: Three times a day (TID) | ORAL | Status: AC

## 2015-03-01 NOTE — Discharge Instructions (Signed)
Colitis Colitis is inflammation of the colon. Colitis can be a short-term or long-standing (chronic) illness. Crohn's disease and ulcerative colitis are 2 types of colitis which are chronic. They usually require lifelong treatment. CAUSES  There are many different causes of colitis, including:  Viruses.  Germs (bacteria).  Medicine reactions. SYMPTOMS   Diarrhea.  Intestinal bleeding.  Pain.  Fever.  Throwing up (vomiting).  Tiredness (fatigue).  Weight loss.  Bowel blockage. DIAGNOSIS  The diagnosis of colitis is based on examination and stool or blood tests. X-rays, CT scan, and colonoscopy may also be needed. TREATMENT  Treatment may include:  Fluids given through the vein (intravenously).  Bowel rest (nothing to eat or drink for a period of time).  Medicine for pain and diarrhea.  Medicines (antibiotics) that kill germs.  Cortisone medicines.  Surgery. HOME CARE INSTRUCTIONS   Get plenty of rest.  Drink enough water and fluids to keep your urine clear or pale yellow.  Eat a well-balanced diet.  Call your caregiver for follow-up as recommended. SEEK IMMEDIATE MEDICAL CARE IF:   You develop chills.  You have an oral temperature above 102 F (38.9 C), not controlled by medicine.  You have extreme weakness, fainting, or dehydration.  You have repeated vomiting.  You develop severe belly (abdominal) pain or are passing bloody or tarry stools. MAKE SURE YOU:   Understand these instructions.  Will watch your condition.  Will get help right away if you are not doing well or get worse. Document Released: 08/09/2004 Document Revised: 09/24/2011 Document Reviewed: 11/04/2009 [facility] Patient Information 2015 [place], [place]. This information is not intended to replace advice given to you by your health care provider. Make sure you discuss any questions you have with your health care provider.   Pain of Unknown Etiology (Pain Without a Known  Cause) You have come to your caregiver because of pain. Pain can occur in any part of the body. Often there is not a definite cause. If your laboratory (blood or urine) work was normal and X-rays or other studies were normal, your caregiver may treat you without knowing the cause of the pain. An example of this is the headache. Most headaches are diagnosed by taking a history. This means your caregiver asks you questions about your headaches. Your caregiver determines a treatment based on your answers. Usually testing done for headaches is normal. Often testing is not done unless there is no response to medications. Regardless of where your pain is located today, you can be given medications to make you comfortable. If no physical cause of pain can be found, most cases of pain will gradually leave as suddenly as they came.  If you have a painful condition and no reason can be found for the pain, it is important that you follow up with your caregiver. If the pain becomes worse or does not go away, it may be necessary to repeat tests and look further for a possible cause.  Only take over-the-counter or prescription medicines for pain, discomfort, or fever as directed by your caregiver.  For the protection of your privacy, test results cannot be given over the phone. Make sure you receive the results of your test. Ask how these results are to be obtained if you have not been informed. It is your responsibility to obtain your test results.  You may continue all activities unless the activities cause more pain. When the pain lessens, it is important to gradually resume normal activities. Resume activities by beginning  slowly and gradually increasing the intensity and duration of the activities or exercise. During periods of severe pain, bed rest may be helpful. Lie or sit in any position that is comfortable.  Ice used for acute (sudden) conditions may be effective. Use a large plastic bag filled with ice and  wrapped in a towel. This may provide pain relief.  See your caregiver for continued problems. Your caregiver can help or refer you for exercises or physical therapy if necessary. If you were given medications for your condition, do not drive, operate machinery or power tools, or sign legal documents for 24 hours. Do not drink alcohol, take sleeping pills, or take other medications that may interfere with treatment. See your caregiver immediately if you have pain that is becoming worse and not relieved by medications. Document Released: 03/27/2001 Document Revised: 04/22/2013 Document Reviewed: 07/02/2005 [facility] Patient Information 2015 [place], [place]. This information is not intended to replace advice given to you by your health care provider. Make sure you discuss any questions you have with your health care provider.

## 2015-03-01 NOTE — ED Notes (Signed)
Patient transported to CT 

## 2015-03-01 NOTE — ED Provider Notes (Signed)
IMPRESSION: 1. Mild wall thickening in the terminal ileum, cecum, and ascending colon, suspicious for mild ileal colitis. 2. Moderate to large bilateral pleural effusions with passive atelectasis. 3. Extensive atherosclerosis. 4. Compression fractures of all lumbar vertebra with multilevel vertebral augmentations and multilevel impingement suspected. I do not see a well-defined sacral fracture.  Patient will be discharged with antibiotics to cover for colitis, has received gentle saline hydration here.  Family was taking him home, he is DO NOT RESUSCITATE on hospice care. Labs have not significantly changed from baseline  Emily Filbert, MD 03/01/15 [phone]

## 2015-03-01 NOTE — Progress Notes (Signed)
ED Visit made. Patient is followed by [facility] at home with a hospice diagnosis of CHF, he is a DNR code and has a portable DNR in place in his home. He lives with his daughter Dennie Bible, who brought him to the ED today for evaluation of nausea/vomiting, she was present during the visit as well as his daughter Darel Hong and Chief of Staff. Dennie Bible was concerned that he was dehydrated. Patient is also complaining of pain in his "tailbone", which is chronic per family. He does have a stage II pressure ulcer to his sacrum, dressing intact. ED physician Dr. Silverio Lay in during visit, discussed hospice care at home. Patient is slightly dehydrated based on labs, he will receive some fluids and have a CT of his abdomen/pelvis based on his c/o of pain. Mr. Spang was alert and quietly answered questions. Emotional support offered. CMRN Berna Bue made aware that patient was followed by [facility]. Will continue to follow through final disposition and provide update to the hospice team. Dayna Barker RN, BSN, [facility] [place], [facility] [phone]

## 2015-03-01 NOTE — ED Provider Notes (Signed)
CSN: [id number]     Arrival date & time 03/01/15  1146 History   First MD Initiated Contact with Patient 03/01/15 1440     Chief Complaint  Patient presents with  . Nausea  . Diarrhea     (Consider location/radiation/quality/duration/timing/severity/associated sxs/prior Treatment) The history is provided by the patient.  Trevor [name] is a 81 y.o. male hx of CHF, CABG, CKD here with abdominal pain, vomiting. Patient has been vomiting for the last several days. Also fell several months ago and complains of worsening tail bone pain. Denies fevers. Family noticed skin tear in the sacral area. Patient is DNR and is at home with hospice for end stage CHF.   Level V caveat- dementia      Past Medical History  Diagnosis Date  . Congestive heart failure   . CAD (coronary artery disease)   . Diabetes type 2, controlled   . CKD (chronic kidney disease), stage III   . HTN (hypertension)   . Anemia   . Myocardial infarct   . Osteoarthritis    Past Surgical History  Procedure Laterality Date  . Coronary artery bypass graft    . Appendectomy    . Hip pinning Left 07/03/2012  . Total hip arthroplasty Right   . Hip hardware removal Right   . Enucleation Left     replaced with glass eye   Family History  Problem Relation Age of Onset  . Stomach cancer Mother    Social History  Substance Use Topics  . Smoking status: Former Games developer  . Smokeless tobacco: None  . Alcohol Use: No    Review of Systems  Gastrointestinal: Positive for vomiting and diarrhea.  All other systems reviewed and are negative.     Allergies  Ambien  Home Medications   Prior to Admission medications   Not on File   BP 123/39 mmHg  Pulse 95  Temp(Src) 98.6 F (37 C) (Oral)  Ht  (1.702 m)  Wt 189 lb (85.73 kg)  BMI 29.59 kg/m2  SpO2 95% Physical Exam  Constitutional:  Chronically ill, dehydrated   HENT:  Head: Normocephalic.  MM slightly dry   Eyes: Conjunctivae are normal. Pupils are  equal, round, and reactive to light.  Neck: Normal range of motion. Neck supple.  Cardiovascular: Normal rate, regular rhythm and normal heart sounds.   Pulmonary/Chest: Effort normal and breath sounds normal. No respiratory distress. He has no wheezes. He has no rales.  Abdominal: Soft. Bowel sounds are normal. He exhibits no distension. There is no tenderness. There is no rebound and no guarding.  Genitourinary:  Stage 2 sacral decub, no signs of infection   Musculoskeletal: Normal range of motion.  Nl ROM bilateral hips   Neurological:  Demented, moving all extremities   Skin: Skin is warm and dry.  Psychiatric:  Unable   Nursing note and vitals reviewed.   ED Course  Procedures (including critical care time) Labs Review Labs Reviewed  COMPREHENSIVE METABOLIC PANEL - Abnormal; Notable for the following:    Potassium 3.4 (*)    Chloride 91 (*)    CO2 33 (*)    Glucose, Bld 173 (*)    BUN 45 (*)    Creatinine, Ser 2.65 (*)    Calcium 8.1 (*)    Albumin 2.8 (*)    AST 43 (*)    ALT 8 (*)    Alkaline Phosphatase 134 (*)    Total Bilirubin 1.6 (*)    GFR calc  non Af Amer 19 (*)    GFR calc Af Amer 22 (*)    All other components within normal limits  CBC WITH DIFFERENTIAL/PLATELET  LIPASE, BLOOD  TROPONIN I  URINALYSIS COMPLETEWITH MICROSCOPIC ([facility] ONLY)    Imaging Review No results found. I have personally reviewed and evaluated these images and lab results as part of my medical decision-making.   EKG Interpretation None      MDM   Final diagnoses:  None   Trevor [name] is a 81 y.o. male here with vomiting, ab pain, diarrhea. Has stage 2 ulcer with no infection. Will get labs, CT ab/pel, UA,   3:22 PM Cr slightly elevated. Given end stage CHF, will hydrate gently. Will get CT ab/pel, if neg can dc home. Signed out to Dr. Mayford Knife in the ED.   Richardean Canal, MD 03/01/15 [phone]

## 2015-03-01 NOTE — ED Notes (Signed)
Pt c/o skin tear to sacral area and c/o pain there with generalized weakness

## 2015-03-01 NOTE — ED Notes (Signed)
Reports n/v/d x 2 days.  Pt a&o, skin w/d

## 2015-03-01 NOTE — ED Notes (Signed)
Pt is hospice pt d/t CHF. Pt c/o tailbone pain, noted to have stage 2 pressure ulcer to coccyx noted upon assessment by this Clinical research associate and edp. Hospice care at the bedside along with family.

## 2015-03-04 ENCOUNTER — Ambulatory Visit: Admission: RE | Admit: 2015-03-04 | Payer: Medicare Other | Source: Ambulatory Visit

## 2015-03-10 ENCOUNTER — Other Ambulatory Visit: Payer: Self-pay | Admitting: Physician Assistant

## 2015-03-10 DIAGNOSIS — S32020A Wedge compression fracture of second lumbar vertebra, initial encounter for closed fracture: Secondary | ICD-10-CM

## 2015-03-14 ENCOUNTER — Ambulatory Visit
Admission: RE | Admit: 2015-03-14 | Discharge: 2015-03-14 | Disposition: A | Discharge status: Home / Self Care | Payer: Medicare Other | Source: Ambulatory Visit | Attending: Physician Assistant | Admitting: Physician Assistant

## 2015-03-14 DIAGNOSIS — X58XXXA Exposure to other specified factors, initial encounter: Secondary | ICD-10-CM | POA: Insufficient documentation

## 2015-03-14 DIAGNOSIS — S32020A Wedge compression fracture of second lumbar vertebra, initial encounter for closed fracture: Secondary | ICD-10-CM | POA: Insufficient documentation

## 2015-03-14 MED ORDER — TECHNETIUM TC 99M MEDRONATE IV KIT
25.0000 | PACK | Freq: Once | INTRAVENOUS | Status: AC | PRN
  Administered 2015-03-14: 21.942 via INTRAVENOUS

## 2015-04-16 DEATH — deceased
